# Patient Record
Sex: Male | Born: 1980 | Race: White | Hispanic: No | Marital: Single | Smoking: Former smoker
Health system: Southern US, Community
[De-identification: ages and names within clinical notes are randomized; demographics above are authoritative.]

## PROBLEM LIST (undated history)

## (undated) DIAGNOSIS — M436 Torticollis: Secondary | ICD-10-CM

---
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
## 2002-03-04 ENCOUNTER — Emergency Department (HOSPITAL_COMMUNITY): Admission: EM | Admit: 2002-03-04 | Discharge: 2002-03-04 | Payer: Self-pay | Admitting: Emergency Medicine

## 2002-03-13 ENCOUNTER — Emergency Department (HOSPITAL_COMMUNITY): Admission: EM | Admit: 2002-03-13 | Discharge: 2002-03-13 | Payer: Self-pay | Admitting: Emergency Medicine

## 2004-12-02 ENCOUNTER — Encounter: Admission: RE | Admit: 2004-12-02 | Discharge: 2004-12-02 | Payer: Self-pay | Admitting: Family Medicine

## 2004-12-02 ENCOUNTER — Ambulatory Visit: Payer: Self-pay | Admitting: Family Medicine

## 2004-12-04 ENCOUNTER — Ambulatory Visit: Payer: Self-pay | Admitting: Family Medicine

## 2005-05-25 ENCOUNTER — Ambulatory Visit: Payer: Self-pay | Admitting: Family Medicine

## 2008-03-10 ENCOUNTER — Emergency Department (HOSPITAL_COMMUNITY): Admission: EM | Admit: 2008-03-10 | Discharge: 2008-03-10 | Payer: Self-pay | Admitting: Family Medicine

## 2009-04-14 ENCOUNTER — Emergency Department (HOSPITAL_COMMUNITY): Admission: EM | Admit: 2009-04-14 | Discharge: 2009-04-14 | Payer: Self-pay | Admitting: Emergency Medicine

## 2009-07-16 ENCOUNTER — Emergency Department (HOSPITAL_COMMUNITY): Admission: EM | Admit: 2009-07-16 | Discharge: 2009-07-16 | Payer: Self-pay | Admitting: Family Medicine

## 2011-01-11 ENCOUNTER — Emergency Department (HOSPITAL_COMMUNITY)
Admission: EM | Admit: 2011-01-11 | Discharge: 2011-01-11 | Disposition: A | Discharge status: Home / Self Care | Payer: Self-pay | Attending: Emergency Medicine | Admitting: Emergency Medicine

## 2011-01-11 DIAGNOSIS — K089 Disorder of teeth and supporting structures, unspecified: Secondary | ICD-10-CM | POA: Insufficient documentation

## 2011-01-11 DIAGNOSIS — K029 Dental caries, unspecified: Secondary | ICD-10-CM | POA: Insufficient documentation

## 2011-01-11 DIAGNOSIS — K0381 Cracked tooth: Secondary | ICD-10-CM | POA: Insufficient documentation

## 2011-01-16 NOTE — Op Note (Signed)
[place]  Patient:    Luke Francis, Luke Francis Visit Number: [id number] MRN: [id number]          Service Type: DSU Location: [facility] Attending Physician:  Fernande Boyden Dictated by:   Gloris Manchester. Lazarus Salines, M.D. Proc. Date: 12/23/01 Admit Date:  12/23/2001                             Operative Report  PREOPERATIVE DIAGNOSIS:  Acute displaced nasal fracture.  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  OPERATION PERFORMED:  Attempted closed reduction of nasal fracture.  SURGEON:  Gloris Manchester. Lazarus Salines, M.D.  ANESTHESIA:  MAC.  ESTIMATED BLOOD LOSS:  None.  COMPLICATIONS:  None.  FINDINGS:  A prominently leftward deviated bony nasal dorsum essentially immobile to attempted closed reduction.  A corresponding narrowing of the right nasal chamber with some heavy right maxillary crest spurring from the septum.  DESCRIPTION OF PROCEDURE:  With the patient in a comfortable supine position, having received preoperative Afrin nasal spray for decongestion, intravenous sedation was administered.  At an appropriate level, the flat fracture elevator ____________ to the level of the medial canthus to avoid injury in the region of the cribriform plate.  It was placed under the nasal dorsum and with anterior and right lateral traction, attempting both nasal passages and attempted two or three separate times, I was unable to mobilize the nasal bones or the entire nasal dorsum.  There was no bleeding noted.  No external splint was felt to be required.  A small amount of old blood and saliva was suctioned free from the pharynx.  At this point the procedure was completed. The patient was returned to anesthesia, awakened and transferred to recovery in stable condition.  COMMENT:   An 31 year old white male 11 days status post a blunt trauma to the nose from a Lacrosse ball with reportedly new cosmetic and functional displacement was the indication for  todays procedure.  The finding of immobility suggests that it healed quickly or that this was in fact a premorbid injury deformity.  Anticipate a routine postoperative recovery with precautions today allowing anesthesia to be dissipated completely. Dictated by:   Gloris Manchester. Lazarus Salines, M.D. Attending Physician:  Fernande Boyden DD:  12/23/01 TD:  12/23/01 Job: [id number]

## 2012-07-05 ENCOUNTER — Encounter (HOSPITAL_COMMUNITY): Payer: Self-pay | Admitting: Physical Medicine and Rehabilitation

## 2012-07-05 ENCOUNTER — Emergency Department (HOSPITAL_COMMUNITY): Payer: Self-pay

## 2012-07-05 ENCOUNTER — Emergency Department (HOSPITAL_COMMUNITY)
Admission: EM | Admit: 2012-07-05 | Discharge: 2012-07-05 | Disposition: A | Discharge status: Home / Self Care | Payer: Self-pay | Attending: Emergency Medicine | Admitting: Emergency Medicine

## 2012-07-05 DIAGNOSIS — S62639A Displaced fracture of distal phalanx of unspecified finger, initial encounter for closed fracture: Secondary | ICD-10-CM | POA: Insufficient documentation

## 2012-07-05 DIAGNOSIS — Y929 Unspecified place or not applicable: Secondary | ICD-10-CM | POA: Insufficient documentation

## 2012-07-05 DIAGNOSIS — IMO0001 Reserved for inherently not codable concepts without codable children: Secondary | ICD-10-CM

## 2012-07-05 DIAGNOSIS — F17200 Nicotine dependence, unspecified, uncomplicated: Secondary | ICD-10-CM | POA: Insufficient documentation

## 2012-07-05 DIAGNOSIS — S62633A Displaced fracture of distal phalanx of left middle finger, initial encounter for closed fracture: Secondary | ICD-10-CM

## 2012-07-05 DIAGNOSIS — S61209A Unspecified open wound of unspecified finger without damage to nail, initial encounter: Secondary | ICD-10-CM | POA: Insufficient documentation

## 2012-07-05 DIAGNOSIS — S61218A Laceration without foreign body of other finger without damage to nail, initial encounter: Secondary | ICD-10-CM

## 2012-07-05 DIAGNOSIS — Y9389 Activity, other specified: Secondary | ICD-10-CM | POA: Insufficient documentation

## 2012-07-05 DIAGNOSIS — IMO0002 Reserved for concepts with insufficient information to code with codable children: Secondary | ICD-10-CM | POA: Insufficient documentation

## 2012-07-05 MED ORDER — OXYCODONE-ACETAMINOPHEN 5-325 MG PO TABS: 1.0000 | ORAL_TABLET | ORAL | Status: AC | PRN

## 2012-07-05 MED ORDER — LIDOCAINE HCL 2 % IJ SOLN
10.0000 mL | Freq: Once | INTRAMUSCULAR | Status: AC
  Administered 2012-07-05: 200 mg
  Filled 2012-07-05: qty 40

## 2012-07-05 MED ORDER — CEPHALEXIN 500 MG PO CAPS: 500.0000 mg | ORAL_CAPSULE | Freq: Four times a day (QID) | ORAL | Status: DC

## 2012-07-05 NOTE — ED Notes (Signed)
Patient has full ROM of left hand and all fingers of left hand.  All pulses present and within normal limits in bilateral upper extremities.

## 2012-07-05 NOTE — ED Provider Notes (Signed)
History  Scribed for No att. providers found, the patient was seen in room TR04C/TR04C. This chart was scribed by Candelaria Stagers. The patient's care started at 5:47 PM   CSN: [id number]  Arrival date & time 07/05/12  1449   None     Chief Complaint  Patient presents with  . Laceration     The history is provided by the patient. No language interpreter was used.   Luke Francis is a 31 y.o. male who presents to the Emergency Department complaining of a laceration to his left middle finger after a wooden desk landed on his finger while helping a friend move earlier today.  He describes the pain as a 5/10.  Pt reports he has had a tetanus shot in the last ten year.   No past medical history on file.  No past surgical history on file.  History reviewed. No pertinent family history.  History  Substance Use Topics  . Smoking status: Current Every Day Smoker    Types: Cigarettes  . Smokeless tobacco: Not on file  . Alcohol Use: Yes     Comment: social      Review of Systems  Skin: Positive for wound (laceration to the left middle finger).  All other systems reviewed and are negative.    Allergies  Review of patient's allergies indicates no known allergies.  Home Medications  No current outpatient prescriptions on file.  BP 141/87  Pulse 104  Temp 98 F (36.7 C) (Oral)  Resp 18  SpO2 99%  Physical Exam  Nursing note and vitals reviewed. Constitutional: He is oriented to person, place, and time. He appears well-developed and well-nourished. No distress.  HENT:  Head: Normocephalic and atraumatic.  Eyes: EOM are normal. Pupils are equal, round, and reactive to light.  Neck: Neck supple. No tracheal deviation present.  Cardiovascular: Normal rate.   Pulmonary/Chest: Effort normal. No respiratory distress.  Abdominal: Soft. He exhibits no distension.  Musculoskeletal: Normal range of motion. He exhibits no edema.       Ecchymosis and two puncture wounds with fat  extruding on the palm side of left middle finger. Subungual hematoma on the dorsum side.     Neurological: He is alert and oriented to person, place, and time. No sensory deficit.  Skin: Skin is warm and dry.  Psychiatric: He has a normal mood and affect. His behavior is normal.    ED Course  Procedures   DIAGNOSTIC STUDIES: Oxygen Saturation is 99% on room air, normal by my interpretation.    COORDINATION OF CARE:  2:59 Ordered: DG Finger Middle Left  5:51 PM Will stitch the wound and provide a finger splint.  Pt understands and agrees.   6:23 PMLACERATION REPAIR Performed by: Carleene Cooper III, MD Consent: Verbal consent obtained. Risks and benefits: risks, benefits and alternatives were discussed Patient identity confirmed: provided demographic data Time out performed prior to procedure Prepped and Draped in normal sterile fashion Wound explored Laceration Location: palmar surface of distal phalanx of left middle finger.  Laceration Length: 2  0.5 cm lacerations No Foreign Bodies seen or palpated Anesthesia: local infiltration Local anesthetic: lidocaine 2% without epinephrine Anesthetic total: 6 ml Irrigation method: syringe Amount of cleaning: standard Number of sutures or staples: 4 stitches with 5.0 prolene  Technique: simple suture  Patient tolerance: Patient tolerated the procedure well with no immediate complications.  6:54 PM cautery to burn a hole in nail to release subungual hematoma under the left middle fingernail  Labs Reviewed - No data to display Dg Finger Middle Left  07/05/2012  *RADIOLOGY REPORT*  Clinical Data: Pain post trauma  LEFT MIDDLE FINGER 2+V  Comparison: None.  Findings:  Frontal, oblique, lateral views were obtained.  There is a comminuted fracture of the distal aspect of the third distal phalanx.  Several fracture fragments are displaced.  There is also ungual disruption in this area.  No other fractures.  No dislocation.  There is distal  soft tissue injury.  IMPRESSION: Comminuted fracture distal aspect third distal phalanx with ungual disruption.  The fracture with the ungual disruption should be considered an open fracture.   Original Report Authenticated By: Bretta Bang, M.D.      IMP:  Lacerations of tip of left middle finger          Fracture of tuft of distal phalanx of left middle finger          Subungual hematoma of the left middle finger.   DISP:  Rest with left hand elevated.  Rx Keflex to protect from infection and Percocet q4h prn pain.  No work for one week.  Return for suture removal in 7 to 10 Days.   I personally performed the services described in this documentation, which was scribed in my presence. The recorded information has been reviewed and considered.  Osvaldo Human, MD     Carleene Cooper III, MD 07/05/12 1901

## 2012-07-05 NOTE — ED Notes (Signed)
Wound to left hand cleansed - dry dressing applied - pt tolerated procedure well

## 2012-07-05 NOTE — ED Notes (Signed)
Pt presents to department for evaluation of L middle finger injury. States he was lifting wooden desk when finger was smashed accidentally. 3 small lacerations noted to L middle finger. Bleeding controlled upon arrival. Pt states he is up to date on tetanus. 5/10 pain at the time. Able to wiggle digit. Capillary refill less than 2 seconds. Pt is alert and oriented x4.

## 2012-07-14 ENCOUNTER — Encounter (HOSPITAL_COMMUNITY): Payer: Self-pay | Admitting: Emergency Medicine

## 2012-07-14 ENCOUNTER — Emergency Department (INDEPENDENT_AMBULATORY_CARE_PROVIDER_SITE_OTHER)
Admission: EM | Admit: 2012-07-14 | Discharge: 2012-07-14 | Disposition: A | Discharge status: Home / Self Care | Payer: Self-pay | Source: Home / Self Care

## 2012-07-14 DIAGNOSIS — Z4802 Encounter for removal of sutures: Secondary | ICD-10-CM

## 2012-07-14 DIAGNOSIS — M79609 Pain in unspecified limb: Secondary | ICD-10-CM

## 2012-07-14 DIAGNOSIS — M79646 Pain in unspecified finger(s): Secondary | ICD-10-CM

## 2012-07-14 NOTE — ED Provider Notes (Signed)
Medical screening examination/treatment/procedure(s) were performed by non-physician practitioner and as supervising physician I was immediately available for consultation/collaboration.  Leslee Home, M.D.   Reuben Likes, MD 07/14/12 [phone]

## 2012-07-14 NOTE — ED Notes (Signed)
Pt in for suture removal from left index finger. Pt voices no concerns.

## 2012-07-14 NOTE — ED Provider Notes (Signed)
History     CSN: [id number]  Arrival date & time 07/14/12  1319   None     Chief Complaint  Patient presents with  . Suture / Staple Removal    suture removal. no complaints.    (Consider location/radiation/quality/duration/timing/severity/associated sxs/prior treatment) HPI Comments: 31 year old male who received a laceration to the distal aspect of his left middle finger 9 days ago. He was seen in the emergency department and the laceration was sutured. An x-ray was obtained and a tuft fracture was also seen. The patient was placed on empiric antibiotics as well as Percocet for pain medicine. He is in the urgent care to have the sutures removed.  The 4 sutures remain intact. The lacerations are healing well, edges have been well approximated, and there are no signs of infection. Some distal swelling remains but has improved over the initial swelling.Distal aspect remains tender    Patient is a 31 y.o. male presenting with suture removal.  Suture / Staple Removal     History reviewed. No pertinent past medical history.  History reviewed. No pertinent past surgical history.  History reviewed. No pertinent family history.  History  Substance Use Topics  . Smoking status: Current Every Day Smoker    Types: Cigarettes  . Smokeless tobacco: Not on file  . Alcohol Use: Yes     Comment: social      Review of Systems  All other systems reviewed and are negative.    Allergies  Review of patient's allergies indicates no known allergies.  Home Medications   Current Outpatient Rx  Name  Route  Sig  Dispense  Refill  . CEPHALEXIN 500 MG PO CAPS   Oral   Take 1 capsule (500 mg total) by mouth 4 (four) times daily.   20 capsule   0   . OXYCODONE-ACETAMINOPHEN 5-325 MG PO TABS   Oral   Take 1 tablet by mouth every 4 (four) hours as needed for pain.   20 tablet   0     BP 125/83  Pulse 74  Temp 98.3 F (36.8 C) (Oral)  Resp 18  SpO2 100%  Physical  Exam  ED Course  Procedures (including critical care time)  Labs Reviewed - No data to display No results found.   1. Visit for suture removal   2. Finger pain       MDM  All 4 sutures were removed. The sinus infection healing well there is still mild edema and moderate tenderness in the distal phalanx. Flexion and extension intact but somewhat limited due to the edema. And station intact. He went back to work a couple days ago but that lifting boxes and other object at [employer] hardware increases pain. I applied a distal finger protection splint so he may limit further injury to the finger. Turn for any signs of infection, increased swelling, redness, puffiness, drainage or other problems.        Hayden Rasmussen, NP 07/14/12 [phone]

## 2012-08-17 ENCOUNTER — Encounter (HOSPITAL_COMMUNITY): Payer: Self-pay | Admitting: Emergency Medicine

## 2012-08-17 ENCOUNTER — Emergency Department (HOSPITAL_COMMUNITY)
Admission: EM | Admit: 2012-08-17 | Discharge: 2012-08-17 | Disposition: A | Discharge status: Home / Self Care | Payer: Self-pay | Attending: Emergency Medicine | Admitting: Emergency Medicine

## 2012-08-17 ENCOUNTER — Emergency Department (HOSPITAL_COMMUNITY): Payer: Self-pay

## 2012-08-17 DIAGNOSIS — R05 Cough: Secondary | ICD-10-CM | POA: Insufficient documentation

## 2012-08-17 DIAGNOSIS — M2550 Pain in unspecified joint: Secondary | ICD-10-CM | POA: Insufficient documentation

## 2012-08-17 DIAGNOSIS — F17200 Nicotine dependence, unspecified, uncomplicated: Secondary | ICD-10-CM | POA: Insufficient documentation

## 2012-08-17 DIAGNOSIS — M25569 Pain in unspecified knee: Secondary | ICD-10-CM | POA: Insufficient documentation

## 2012-08-17 DIAGNOSIS — J029 Acute pharyngitis, unspecified: Secondary | ICD-10-CM | POA: Insufficient documentation

## 2012-08-17 DIAGNOSIS — B9789 Other viral agents as the cause of diseases classified elsewhere: Secondary | ICD-10-CM | POA: Insufficient documentation

## 2012-08-17 DIAGNOSIS — B349 Viral infection, unspecified: Secondary | ICD-10-CM

## 2012-08-17 DIAGNOSIS — R059 Cough, unspecified: Secondary | ICD-10-CM | POA: Insufficient documentation

## 2012-08-17 LAB — RAPID STREP SCREEN (MED CTR MEBANE ONLY): Streptococcus, Group A Screen (Direct): NEGATIVE

## 2012-08-17 MED ORDER — ACETAMINOPHEN 500 MG PO TABS: 1000.0000 mg | ORAL_TABLET | Freq: Once | ORAL | Status: DC

## 2012-08-17 MED ORDER — ACETAMINOPHEN 325 MG PO TABS
975.0000 mg | ORAL_TABLET | Freq: Once | ORAL | Status: AC
  Administered 2012-08-17: 975 mg via ORAL
  Filled 2012-08-17: qty 3

## 2012-08-17 NOTE — ED Provider Notes (Signed)
History    This chart was scribed for Geoffery Lyons, MD, MD by Smitty Pluck, ED Scribe. The patient was seen in room TR05C and the patient's care was started at 11:45AM.   CSN: [id number]  Arrival date & time 08/17/12  1103   None     Chief Complaint  Patient presents with  . Fever  . Knee Pain    (Consider location/radiation/quality/duration/timing/severity/associated sxs/prior treatment) The history is provided by the patient. No language interpreter was used.   Luke Francis is a 31 y.o. male who presents to the Emergency Department complaining of constant, moderate, sharp bilateral leg pain radiating from knee to ankles onset 1 day ago. Pt reports having fever this AM with temperature of 102.9. Pt's current temperature in ED is 100.8. Pt reports that he has cough with green sputum that started 3 days ago and mild sore throat. Pt denies sick contact, trauma to knees, fall, nausea, vomiting, diarrhea, taking medication PTA and any other pain. Pt denies receiving influenza vaccination this year.   History reviewed. No pertinent past medical history.  History reviewed. No pertinent past surgical history.  No family history on file.  History  Substance Use Topics  . Smoking status: Current Every Day Smoker -- 1.0 packs/day    Types: Cigarettes  . Smokeless tobacco: Not on file  . Alcohol Use: Yes     Comment: social      Review of Systems  Constitutional: Negative for fever and chills.  HENT: Positive for sore throat.   Respiratory: Positive for cough. Negative for shortness of breath.   Gastrointestinal: Negative for nausea, vomiting and diarrhea.  Musculoskeletal: Positive for arthralgias.  Neurological: Negative for weakness.  All other systems reviewed and are negative.    Allergies  Review of patient's allergies indicates no known allergies.  Home Medications   Current Outpatient Rx  Name  Route  Sig  Dispense  Refill  . CEPHALEXIN 500 MG PO CAPS   Oral  Take 1 capsule (500 mg total) by mouth 4 (four) times daily.   20 capsule   0     BP 118/71  Pulse 109  Temp 98.8 F (37.1 C) (Oral)  Resp 20  SpO2 99%  Physical Exam  Nursing note and vitals reviewed. Constitutional: He is oriented to person, place, and time. He appears well-developed and well-nourished. No distress.  HENT:  Head: Normocephalic and atraumatic.  Mouth/Throat: No oropharyngeal exudate.       Mildly erythematous   Eyes: EOM are normal.  Neck: Neck supple. No tracheal deviation present.  Cardiovascular: Normal rate, regular rhythm and intact distal pulses.   Pulmonary/Chest: Effort normal and breath sounds normal. No respiratory distress.  Musculoskeletal: Normal range of motion.       Knees appear grossly normal. There is no effusion or erythema or warmth.   Neurological: He is alert and oriented to person, place, and time.  Skin: Skin is warm and dry.  Psychiatric: He has a normal mood and affect. His behavior is normal.    ED Course  Procedures (including critical care time) DIAGNOSTIC STUDIES: Oxygen Saturation is 99% on room air, normal by my interpretation.    COORDINATION OF CARE: 11:49 AM Discussed ED treatment with pt         Labs Reviewed - No data to display Dg Chest 2 View  08/17/2012  *RADIOLOGY REPORT*  Clinical Data: Fever  CHEST - 2 VIEW  Comparison: None.  Findings: The lungs are clear without  focal consolidation, edema, effusion or pneumothorax.  Cardiopericardial silhouette is within normal limits for size.  Imaged bony structures of the thorax are intact.  IMPRESSION: Normal exam.   Original Report Authenticated By: Kennith Center, M.D.      No diagnosis found.    MDM  The chest xray and strep test are negative.  This is most likely a viral process.  Will recommend tylenol, motrin, fluids, and rest.      I personally performed the services described in this documentation, which was scribed in my presence. The recorded  information has been reviewed and is accurate.        Geoffery Lyons, MD 08/17/12 1310

## 2012-08-17 NOTE — ED Notes (Signed)
Pt ambulated to room with no issues. Pt c/o bilateral knee pain, denies injury.

## 2012-08-17 NOTE — ED Notes (Signed)
Pt c/o fever and got sharp pain this morning in bilateral knees. Pt denies taking any medications for fever. Pt denies hx of knee pain/sx.

## 2013-02-28 ENCOUNTER — Encounter (HOSPITAL_COMMUNITY): Payer: Self-pay | Admitting: Emergency Medicine

## 2013-02-28 ENCOUNTER — Emergency Department (HOSPITAL_COMMUNITY)
Admission: EM | Admit: 2013-02-28 | Discharge: 2013-02-28 | Disposition: A | Discharge status: Home / Self Care | Payer: BC Managed Care – PPO | Attending: Emergency Medicine | Admitting: Emergency Medicine

## 2013-02-28 ENCOUNTER — Emergency Department (HOSPITAL_COMMUNITY)
Admission: EM | Admit: 2013-02-28 | Discharge: 2013-02-28 | Disposition: A | Discharge status: Home / Self Care | Payer: BC Managed Care – PPO | Source: Home / Self Care

## 2013-02-28 DIAGNOSIS — K469 Unspecified abdominal hernia without obstruction or gangrene: Secondary | ICD-10-CM

## 2013-02-28 DIAGNOSIS — K4091 Unilateral inguinal hernia, without obstruction or gangrene, recurrent: Secondary | ICD-10-CM

## 2013-02-28 DIAGNOSIS — F17200 Nicotine dependence, unspecified, uncomplicated: Secondary | ICD-10-CM | POA: Insufficient documentation

## 2013-02-28 NOTE — ED Provider Notes (Signed)
See previous note from urgent care for H&P.   Patient sent from urgent care Center for evaluation of hernia.  Hernia is currently reducing patient's without complaints.  He released himself last night.  Plan at this time is to consult surgery follow up as outpatient and give limited duty.  Consulted with general surgery.  Demographic information given.  Will place on light duty until sees them.  Given the caution to return to emergency department should he develop swelling or new symptoms.  Nelia Shi, MD 02/28/13 [phone]

## 2013-02-28 NOTE — ED Notes (Signed)
Complains about groin/pelvic pain and he thinks its a hernia.

## 2013-02-28 NOTE — ED Notes (Signed)
Pt c/o left sided groin pain worse since last night; pt sts thinks could be hernia and sent from [facility] for further eval

## 2013-02-28 NOTE — Progress Notes (Addendum)
Called from ED regarding this patient.  Pt's hernia is now reduced, pain is improved.  This is not incarcerated if it is reducible.  Will have the patient follow up in the office for a new patient evaluation.  Agree with lifting no more than 15-20lbs until follow up.

## 2013-02-28 NOTE — ED Provider Notes (Signed)
   History    CSN: [id number] Arrival date & time 02/28/13  1157  First MD Initiated Contact with Patient 02/28/13 1338     Chief Complaint  Patient presents with  . Hernia    pt thinks he has hernia   (Consider location/radiation/quality/duration/timing/severity/associated sxs/prior Treatment) HPI  32 yo wm presents with the above complaint.  Thinks that he has had a left sided hernia for at least one year.  Has never seen anyone for this previously.  Says that he could reduce this without issues and that's why he has never had evaluation.  He has been having increased left side abd pain that extends to his groin over the last 3 weeks.  States that he is having trouble reducing this now.  Pain with ambulation.  Denies nausa, vomiting, fever, chills, dysuria, hematuria, changes in bowel habits.   No past medical history on file. No past surgical history on file. No family history on file. History  Substance Use Topics  . Smoking status: Current Every Day Smoker -- 1.00 packs/day    Types: Cigarettes  . Smokeless tobacco: Not on file  . Alcohol Use: Yes     Comment: social    Review of Systems  Constitutional: Negative.   HENT: Negative.   Eyes: Negative.   Respiratory: Negative.   Gastrointestinal: Negative.   Endocrine: Negative.   Genitourinary: Negative for dysuria, frequency, hematuria, flank pain, discharge, scrotal swelling, difficulty urinating and testicular pain.  Neurological: Negative.   Psychiatric/Behavioral: Negative.     Allergies  Review of patient's allergies indicates no known allergies.  Home Medications  No current outpatient prescriptions on file. BP 123/82  Pulse 83  Temp(Src) 98 F (36.7 C) (Oral)  SpO2 100% Physical Exam  Constitutional: He is oriented to person, place, and time. He appears well-developed and well-nourished.  HENT:  Head: Normocephalic and atraumatic.  Eyes: EOM are normal. Pupils are equal, round, and reactive to light.   Neck: Normal range of motion.  Cardiovascular: Normal rate and regular rhythm.   Pulmonary/Chest: Effort normal and breath sounds normal.  Abdominal: Soft. He exhibits no distension and no mass. There is tenderness (periumbilical and left lower with deep palpation. ). There is no rebound and no guarding.  Musculoskeletal: Normal range of motion.  Neurological: He is alert and oriented to person, place, and time.  Skin: Skin is warm and dry.  Psychiatric: He has a normal mood and affect.    ED Course  Procedures (including critical care time) Labs Reviewed - No data to display No results found. 1. Hernia     MDM  Will send down to ED for ultrasound.  Advised physician will decide if general surgery consult is needed.   All questions answered.    Zonia Kief, PA-C 02/28/13 1433

## 2013-02-28 NOTE — ED Provider Notes (Signed)
Medical screening examination/treatment/procedure(s) were performed by non-physician practitioner and as supervising physician I was immediately available for consultation/collaboration.  Leslee Home, M.D.  Reuben Likes, MD 02/28/13 [phone]

## 2013-03-14 ENCOUNTER — Encounter (INDEPENDENT_AMBULATORY_CARE_PROVIDER_SITE_OTHER): Payer: Self-pay | Admitting: General Surgery

## 2013-03-14 ENCOUNTER — Ambulatory Visit (INDEPENDENT_AMBULATORY_CARE_PROVIDER_SITE_OTHER): Payer: BC Managed Care – PPO | Admitting: General Surgery

## 2013-03-14 VITALS — BP 124/84 | HR 83 | Resp 16 | Ht 74.0 in | Wt 193.2 lb

## 2013-03-14 DIAGNOSIS — K4090 Unilateral inguinal hernia, without obstruction or gangrene, not specified as recurrent: Secondary | ICD-10-CM

## 2013-03-14 NOTE — Progress Notes (Signed)
Patient ID: Luke Francis, male   DOB: May 09, 1981, 32 y.o.   MRN: [id number]  Chief Complaint  Patient presents with  . New Evaluation    eval [facility]    HPI Luke Francis is a 32 y.o. male.  The patient is a 32 year old male who is referred by Dr. Manus Gunning for evaluation of a left inguinal hernia. The patient states that approximately 2-3 weeks 0 he had a incarcerated left inguinal hernia that was reduced. Since the ER for evaluation. The patient does work as a Nurse, adult.  The patient has minimal amount of discomfort in the area however no overt pain.   HPI  History reviewed. No pertinent past medical history.  History reviewed. No pertinent past surgical history.  Family History  Problem Relation Age of Onset  . Hypertension Mother   . Cancer Maternal Grandmother     Breast/ Colon    Social History History  Substance Use Topics  . Smoking status: Current Every Day Smoker -- 1.00 packs/day    Types: Cigarettes  . Smokeless tobacco: Not on file  . Alcohol Use: Yes     Comment: social    No Known Allergies  No current outpatient prescriptions on file.   No current facility-administered medications for this visit.    Review of Systems Review of Systems  Constitutional: Negative.   HENT: Negative.   Respiratory: Negative.   Cardiovascular: Negative.   Gastrointestinal: Negative.   Neurological: Negative.   All other systems reviewed and are negative.    Blood pressure 124/84, pulse 83, resp. rate 16, height 6\' 2"  (1.88 m), weight 193 lb 3.2 oz (87.635 kg), SpO2 97.00%.  Physical Exam Physical Exam  Constitutional: He is oriented to person, place, and time. He appears well-developed and well-nourished.  HENT:  Head: Normocephalic and atraumatic.  Eyes: Conjunctivae and EOM are normal. Pupils are equal, round, and reactive to light.  Neck: Normal range of motion. Neck supple.  Cardiovascular: Normal rate, regular rhythm and normal heart sounds.    Pulmonary/Chest: Effort normal and breath sounds normal.  Abdominal: Soft. Bowel sounds are normal. A hernia is present. Hernia confirmed positive in the left inguinal area.  Reducible left inguinal hernia, likely direct  Musculoskeletal: Normal range of motion.  Neurological: He is alert and oriented to person, place, and time.    Data Reviewed None   Assessment    32 year old male with a reducible left, likely direct inguinal hernia.     Plan    1 we'll proceed to the operating room for a laparoscopic left inguinal hernia repair with mesh. 2. All risks and benefits were discussed with the patient, to generally include infection, bleeding, damage to surrounding structures, acute and chronic nerve pain, and recurrence. Alternatives were offered and described.  All questions were answered and the patient voiced understanding of the procedure and wishes to proceed at this point.         Marigene Ehlers., General Wearing 03/14/2013, 1:41 PM

## 2014-06-19 ENCOUNTER — Other Ambulatory Visit (HOSPITAL_COMMUNITY): Payer: Self-pay | Admitting: Physical Medicine and Rehabilitation

## 2014-06-19 DIAGNOSIS — S161XXA Strain of muscle, fascia and tendon at neck level, initial encounter: Secondary | ICD-10-CM

## 2014-06-26 DIAGNOSIS — Z0271 Encounter for disability determination: Secondary | ICD-10-CM

## 2014-07-05 ENCOUNTER — Ambulatory Visit (HOSPITAL_COMMUNITY)
Admission: RE | Admit: 2014-07-05 | Discharge: 2014-07-05 | Disposition: A | Discharge status: Home / Self Care | Payer: Worker's Compensation | Source: Ambulatory Visit | Attending: Diagnostic Radiology | Admitting: Diagnostic Radiology

## 2014-07-05 DIAGNOSIS — M436 Torticollis: Secondary | ICD-10-CM | POA: Insufficient documentation

## 2014-07-05 DIAGNOSIS — M4802 Spinal stenosis, cervical region: Secondary | ICD-10-CM | POA: Insufficient documentation

## 2014-07-05 DIAGNOSIS — S161XXA Strain of muscle, fascia and tendon at neck level, initial encounter: Secondary | ICD-10-CM

## 2014-12-10 DIAGNOSIS — S0191XA Laceration without foreign body of unspecified part of head, initial encounter: Secondary | ICD-10-CM | POA: Insufficient documentation

## 2014-12-10 DIAGNOSIS — Z8739 Personal history of other diseases of the musculoskeletal system and connective tissue: Secondary | ICD-10-CM | POA: Insufficient documentation

## 2014-12-10 DIAGNOSIS — Y929 Unspecified place or not applicable: Secondary | ICD-10-CM | POA: Insufficient documentation

## 2014-12-10 DIAGNOSIS — Z720 Tobacco use: Secondary | ICD-10-CM | POA: Insufficient documentation

## 2014-12-10 DIAGNOSIS — S0121XA Laceration without foreign body of nose, initial encounter: Secondary | ICD-10-CM | POA: Insufficient documentation

## 2014-12-10 DIAGNOSIS — Y999 Unspecified external cause status: Secondary | ICD-10-CM | POA: Insufficient documentation

## 2014-12-10 DIAGNOSIS — Y939 Activity, unspecified: Secondary | ICD-10-CM | POA: Insufficient documentation

## 2014-12-10 DIAGNOSIS — W01198A Fall on same level from slipping, tripping and stumbling with subsequent striking against other object, initial encounter: Secondary | ICD-10-CM | POA: Insufficient documentation

## 2014-12-10 NOTE — ED Notes (Signed)
The patient said he tripped, fell and hit a wooden bar.  He said his nose bled for about three hours but it is controlled now.  He rates his pain 1/10.  He denies LOC, nausea or vomiting.

## 2014-12-11 ENCOUNTER — Encounter (HOSPITAL_COMMUNITY): Payer: Self-pay | Admitting: Emergency Medicine

## 2014-12-11 ENCOUNTER — Emergency Department (HOSPITAL_COMMUNITY)
Admission: EM | Admit: 2014-12-11 | Discharge: 2014-12-11 | Disposition: A | Discharge status: Home / Self Care | Payer: Self-pay | Attending: Emergency Medicine | Admitting: Emergency Medicine

## 2014-12-11 DIAGNOSIS — S0081XA Abrasion of other part of head, initial encounter: Secondary | ICD-10-CM

## 2014-12-11 HISTORY — DX: Torticollis: M43.6

## 2014-12-11 MED ORDER — LIDOCAINE-EPINEPHRINE (PF) 2 %-1:200000 IJ SOLN
10.0000 mL | Freq: Once | INTRAMUSCULAR | Status: DC
  Filled 2014-12-11: qty 20

## 2014-12-11 NOTE — Discharge Instructions (Signed)
Nasal Fracture A nasal fracture is a break or crack in the bones of the nose. A minor break usually heals in a month. You often will receive black eyes from a nasal fracture. This is not a cause for concern. The black eyes will go away over 1 to 2 weeks.  DIAGNOSIS  Your caregiver may want to examine you if you are concerned about a fracture of the nose. X-rays of the nose may not show a nasal fracture even when one is present. Sometimes your caregiver must wait 1 to 5 days after the injury to re-check the nose for alignment and to take additional X-rays. Sometimes the caregiver must wait until the swelling has gone down. TREATMENT Minor fractures that have caused no deformity often do not require treatment. More serious fractures where bones are displaced may require surgery. This will take place after the swelling is gone. Surgery will stabilize and align the fracture. HOME CARE INSTRUCTIONS   Put ice on the injured area.  Put ice in a plastic bag.  Place a towel between your skin and the bag.  Leave the ice on for 15-20 minutes, 03-04 times a day.  Take medications as directed by your caregiver.  Only take over-the-counter or prescription medicines for pain, discomfort, or fever as directed by your caregiver.  If your nose starts bleeding, squeeze the soft parts of the nose against the center wall while you are sitting in an upright position for 10 minutes.  Contact sports should be avoided for at least 3 to 4 weeks or as directed by your caregiver. SEEK MEDICAL CARE IF:  Your pain increases or becomes severe.  You continue to have nosebleeds.  The shape of your nose does not return to normal within 5 days.  You have pus draining from the nose. SEEK IMMEDIATE MEDICAL CARE IF:   You have bleeding from your nose that does not stop after 20 minutes of pinching the nostrils closed and keeping ice on the nose.  You have clear fluid draining from your nose.  You notice a grape-like  swelling on the dividing wall between the nostrils (septum). This is a collection of blood (hematoma) that must be drained to help prevent infection.  You have difficulty moving your eyes.  You have recurrent vomiting. Document Released: 08/14/2000 Document Revised: 11/09/2011 Document Reviewed: 12/01/2010 [facility] Patient Information 2015 [place], [place]. This information is not intended to replace advice given to you by your health care provider. Make sure you discuss any questions you have with your health care provider. Facial or Scalp Contusion A facial or scalp contusion is a deep bruise on the face or head. Injuries to the face and head generally cause a lot of swelling, especially around the eyes. Contusions are the result of an injury that caused bleeding under the skin. The contusion may turn blue, purple, or yellow. Minor injuries will give you a painless contusion, but more severe contusions may stay painful and swollen for a few weeks.  CAUSES  A facial or scalp contusion is caused by a blunt injury or trauma to the face or head area.  SIGNS AND SYMPTOMS   Swelling of the injured area.   Discoloration of the injured area.   Tenderness, soreness, or pain in the injured area.  DIAGNOSIS  The diagnosis can be made by taking a medical history and doing a physical exam. An X-ray exam, CT scan, or MRI may be needed to determine if there are any associated injuries, such  as broken bones (fractures). TREATMENT  Often, the best treatment for a facial or scalp contusion is applying cold compresses to the injured area. Over-the-counter medicines may also be recommended for pain control.  HOME CARE INSTRUCTIONS   Only take over-the-counter or prescription medicines as directed by your health care provider.   Apply ice to the injured area.   Put ice in a plastic bag.   Place a towel between your skin and the bag.   Leave the ice on for 20 minutes, 2-3 times a day.  SEEK  MEDICAL CARE IF:  You have bite problems.   You have pain with chewing.   You are concerned about facial defects. SEEK IMMEDIATE MEDICAL CARE IF:  You have severe pain or a headache that is not relieved by medicine.   You have unusual sleepiness, confusion, or personality changes.   You throw up (vomit).   You have a persistent nosebleed.   You have double vision or blurred vision.   You have fluid drainage from your nose or ear.   You have difficulty walking or using your arms or legs.  MAKE SURE YOU:   Understand these instructions.  Will watch your condition.  Will get help right away if you are not doing well or get worse. Document Released: 09/24/2004 Document Revised: 06/07/2013 Document Reviewed: 03/30/2013 [facility] Patient Information 2015 [place]. This information is not intended to replace advice given to you by your health care provider. Make sure you discuss any questions you have with your health care provider.

## 2014-12-11 NOTE — ED Notes (Signed)
Pt c/o laceration to nose. Pt tripped and fell face first into a wooden bar. Reports bleeding from both nostrils at the time of the fall. Denies LOC. No bleeding at this time.

## 2014-12-11 NOTE — ED Notes (Signed)
Pt requesting discharge; refuses sutures at this time. PA made aware; dermabond ordered, pt refused dermabond.

## 2014-12-11 NOTE — ED Provider Notes (Signed)
CSN: [id number]     Arrival date & time 12/10/14  2336 History   First MD Initiated Contact with Patient 12/11/14 0026     Chief Complaint  Patient presents with  . Facial Laceration    The patient said he tripped, fell and hit a wooden bar.  He said his nose bled for about three hours but it is controlled now.     (Consider location/radiation/quality/duration/timing/severity/associated sxs/prior Treatment) HPI    PCP: Luke Lance, MD Blood pressure 157/91, pulse 112, temperature 98.1 F (36.7 C), resp. rate 20, weight 248 lb 3 oz (112.577 kg), SpO2 99 %.  Luke Francis is a 34 y.o.male with a significant PMH of torticollis presents to the ER with complaints of facial injury this evening and brought in by his mother. He reports looking at his phone and accidentally walked into a wooden bar. He had epistaxis that lasted for a few hours. He has sustained a small cut to his nasal bridge. He denies loc or neck injury. His mom reports bringing him in to see whether he needs to have his nose set. He is having a pain of 1/10.   Negative Review of Symptoms: loc, neck pain, chest pain, back pain, eye pain, ear pain, mouth pain, fever.   Past Medical History  Diagnosis Date  . Torticollis    History reviewed. No pertinent past surgical history. Family History  Problem Relation Age of Onset  . Hypertension Mother   . Cancer Maternal Grandmother     Breast/ Luke   History  Substance Use Topics  . Smoking status: Current Every Day Smoker -- 1.00 packs/day    Types: Cigarettes  . Smokeless tobacco: Not on file  . Alcohol Use: Yes     Comment: social    Review of Systems  10 Systems reviewed and are negative for acute change except as noted in the HPI.    Allergies  Review of patient's allergies indicates no known allergies.  Home Medications   Prior to Admission medications   Not on File   BP 157/91 mmHg  Pulse 112  Temp(Src) 98.1 F (36.7 C)  Resp 20  Wt 248 lb 3  oz (112.577 kg)  SpO2 99% Physical Exam  Constitutional: He appears well-developed and well-nourished. No distress.  HENT:  Head: Normocephalic. Head is with abrasion, with contusion and with laceration. Head is without raccoon's eyes, without Battle's sign, without right periorbital erythema and without left periorbital erythema.  Right Ear: Tympanic membrane and ear canal normal.  Left Ear: Tympanic membrane and ear canal normal.  Nose: Nose lacerations present. No sinus tenderness, nasal deformity or septal deviation. No epistaxis (no active epistaxis).    Mouth/Throat: Uvula is midline, oropharynx is clear and moist and mucous membranes are normal.  Eyes: Conjunctivae, EOM and lids are normal. Pupils are equal, round, and reactive to light.  Neck: Normal range of motion. Neck supple. No spinous process tenderness and no muscular tenderness present.  Cardiovascular: Normal rate and regular rhythm.   Pulmonary/Chest: Effort normal and breath sounds normal. No accessory muscle usage. No respiratory distress.  Abdominal: Soft.  Neurological: He is alert.  Skin: Skin is warm and dry.  Nursing note and vitals reviewed.   ED Course  Procedures (including critical care time) Labs Review Labs Reviewed - No data to display  Imaging Review No results found.   EKG Interpretation None      MDM   Final diagnoses:  Facial abrasion, initial encounter  Pt reported being UTD on tetanus shot. He declined imaging CT or xray reports "only being here to see if his nose needs to be set". The nurse also witnessed he declined sutures, which I recommended. I offered the patient Dermabond but they declined this as well. I discussed with the patient that fracture cannot be excluded without imaging but that a small fx would not alter treatment plan. EOMIs intact, no septal hematoma or deviation. NO racoon eye or battle sign. Patient requesting to leave without any further evaluation, deny  that they are unsatisfied with there visit but report being tired and wanting to leave.  33 y.o.Luke Flatteryaul D Francis's evaluation in the Emergency Department is complete. It has been determined that no acute conditions requiring further emergency intervention are present at this time. The patient/guardian have been advised of the diagnosis and plan. We have discussed signs and symptoms that warrant return to the ED, such as changes or worsening in symptoms.  Vital signs are stable at discharge. Filed Vitals:   12/10/14 2347  BP: 157/91  Pulse: 112  Temp: 98.1 F (36.7 C)  Resp: 20    Patient/guardian has voiced understanding and agreed to follow-up with the PCP or specialist.     Luke Peliffany Keygan Dumond, PA-C 12/11/14 [id number]  Eber HongBrian Miller, MD 12/11/14 [phone]

## 2015-01-01 ENCOUNTER — Emergency Department (HOSPITAL_COMMUNITY): Payer: BLUE CROSS/BLUE SHIELD | Admitting: Certified Registered Nurse Anesthetist

## 2015-01-01 ENCOUNTER — Encounter (HOSPITAL_COMMUNITY)
Admission: EM | Disposition: A | Discharge status: Home / Self Care | Payer: Self-pay | Source: Home / Self Care | Attending: Emergency Medicine

## 2015-01-01 ENCOUNTER — Ambulatory Visit (HOSPITAL_COMMUNITY)
Admission: EM | Admit: 2015-01-01 | Discharge: 2015-01-01 | Disposition: A | Discharge status: Home / Self Care | Payer: Self-pay | Attending: Emergency Medicine | Admitting: Emergency Medicine

## 2015-01-01 ENCOUNTER — Encounter (HOSPITAL_COMMUNITY): Payer: Self-pay | Admitting: Emergency Medicine

## 2015-01-01 ENCOUNTER — Emergency Department (HOSPITAL_COMMUNITY): Payer: Self-pay

## 2015-01-01 ENCOUNTER — Emergency Department (HOSPITAL_COMMUNITY): Payer: Self-pay | Admitting: Certified Registered Nurse Anesthetist

## 2015-01-01 DIAGNOSIS — S61412A Laceration without foreign body of left hand, initial encounter: Secondary | ICD-10-CM

## 2015-01-01 DIAGNOSIS — W260XXA Contact with knife, initial encounter: Secondary | ICD-10-CM | POA: Insufficient documentation

## 2015-01-01 DIAGNOSIS — S65112A Laceration of radial artery at wrist and hand level of left arm, initial encounter: Secondary | ICD-10-CM | POA: Insufficient documentation

## 2015-01-01 DIAGNOSIS — M436 Torticollis: Secondary | ICD-10-CM | POA: Insufficient documentation

## 2015-01-01 DIAGNOSIS — Z87891 Personal history of nicotine dependence: Secondary | ICD-10-CM | POA: Insufficient documentation

## 2015-01-01 DIAGNOSIS — S66822A Laceration of other specified muscles, fascia and tendons at wrist and hand level, left hand, initial encounter: Secondary | ICD-10-CM | POA: Insufficient documentation

## 2015-01-01 HISTORY — PX: ARTERY AND TENDON REPAIR: SHX5696

## 2015-01-01 HISTORY — PX: I&D EXTREMITY: SHX5045

## 2015-01-01 SURGERY — IRRIGATION AND DEBRIDEMENT EXTREMITY
Anesthesia: General | Site: Hand | Laterality: Left

## 2015-01-01 MED ORDER — HYDROCODONE-ACETAMINOPHEN 5-325 MG PO TABS
1.0000 | ORAL_TABLET | Freq: Once | ORAL | Status: AC
  Administered 2015-01-01: 1 via ORAL
  Filled 2015-01-01: qty 1

## 2015-01-01 MED ORDER — SODIUM CHLORIDE 0.9 % IR SOLN
Status: DC | PRN
  Administered 2015-01-01: 3000 mL

## 2015-01-01 MED ORDER — MORPHINE SULFATE 4 MG/ML IJ SOLN
4.0000 mg | Freq: Once | INTRAMUSCULAR | Status: AC
  Administered 2015-01-01: 4 mg via INTRAVENOUS
  Filled 2015-01-01: qty 1

## 2015-01-01 MED ORDER — OXYCODONE HCL 5 MG PO TABS
ORAL_TABLET | ORAL | Status: AC
  Filled 2015-01-01: qty 1

## 2015-01-01 MED ORDER — CEPHALEXIN 500 MG PO CAPS: 500.0000 mg | ORAL_CAPSULE | Freq: Four times a day (QID) | ORAL | Status: AC

## 2015-01-01 MED ORDER — MIDAZOLAM HCL 5 MG/5ML IJ SOLN
INTRAMUSCULAR | Status: DC | PRN
  Administered 2015-01-01: 2 mg via INTRAVENOUS

## 2015-01-01 MED ORDER — SODIUM CHLORIDE 0.9 % IV SOLN
Freq: Once | INTRAVENOUS | Status: AC
  Administered 2015-01-01: 17:00:00 via INTRAVENOUS

## 2015-01-01 MED ORDER — LACTATED RINGERS IV SOLN
INTRAVENOUS | Status: DC | PRN
  Administered 2015-01-01: 19:00:00 via INTRAVENOUS

## 2015-01-01 MED ORDER — BUPIVACAINE HCL (PF) 0.25 % IJ SOLN
INTRAMUSCULAR | Status: AC
  Filled 2015-01-01: qty 30

## 2015-01-01 MED ORDER — 0.9 % SODIUM CHLORIDE (POUR BTL) OPTIME
TOPICAL | Status: DC | PRN
  Administered 2015-01-01: 1000 mL

## 2015-01-01 MED ORDER — MIDAZOLAM HCL 2 MG/2ML IJ SOLN
INTRAMUSCULAR | Status: AC
  Filled 2015-01-01: qty 2

## 2015-01-01 MED ORDER — SODIUM CHLORIDE 0.9 % IV SOLN
INTRAVENOUS | Status: DC | PRN
  Administered 2015-01-01: 17:00:00 via INTRAVENOUS

## 2015-01-01 MED ORDER — ONDANSETRON HCL 4 MG/2ML IJ SOLN
INTRAMUSCULAR | Status: DC | PRN
  Administered 2015-01-01: 4 mg via INTRAVENOUS

## 2015-01-01 MED ORDER — FENTANYL CITRATE (PF) 100 MCG/2ML IJ SOLN
100.0000 ug | Freq: Once | INTRAMUSCULAR | Status: AC
  Administered 2015-01-01: 100 ug via NASAL
  Filled 2015-01-01: qty 2

## 2015-01-01 MED ORDER — LIDOCAINE HCL (PF) 1 % IJ SOLN
5.0000 mL | Freq: Once | INTRAMUSCULAR | Status: AC
  Administered 2015-01-01: 5 mL via INTRADERMAL
  Filled 2015-01-01: qty 5

## 2015-01-01 MED ORDER — DEXAMETHASONE SODIUM PHOSPHATE 4 MG/ML IJ SOLN
INTRAMUSCULAR | Status: AC
  Filled 2015-01-01: qty 1

## 2015-01-01 MED ORDER — TETANUS-DIPHTH-ACELL PERTUSSIS 5-2.5-18.5 LF-MCG/0.5 IM SUSP
0.5000 mL | Freq: Once | INTRAMUSCULAR | Status: AC
  Administered 2015-01-01: 0.5 mL via INTRAMUSCULAR
  Filled 2015-01-01: qty 0.5

## 2015-01-01 MED ORDER — HYDROMORPHONE HCL 1 MG/ML IJ SOLN
INTRAMUSCULAR | Status: AC
  Filled 2015-01-01: qty 2

## 2015-01-01 MED ORDER — FENTANYL CITRATE (PF) 100 MCG/2ML IJ SOLN
INTRAMUSCULAR | Status: DC | PRN
  Administered 2015-01-01 (×2): 25 ug via INTRAVENOUS
  Administered 2015-01-01: 150 ug via INTRAVENOUS

## 2015-01-01 MED ORDER — PROMETHAZINE HCL 25 MG/ML IJ SOLN: 6.2500 mg | INTRAMUSCULAR | Status: DC | PRN

## 2015-01-01 MED ORDER — ONDANSETRON HCL 4 MG/2ML IJ SOLN
4.0000 mg | Freq: Once | INTRAMUSCULAR | Status: AC
  Administered 2015-01-01: 4 mg via INTRAVENOUS
  Filled 2015-01-01: qty 2

## 2015-01-01 MED ORDER — NEOSTIGMINE METHYLSULFATE 10 MG/10ML IV SOLN
INTRAVENOUS | Status: AC
  Filled 2015-01-01: qty 1

## 2015-01-01 MED ORDER — SUCCINYLCHOLINE CHLORIDE 20 MG/ML IJ SOLN
INTRAMUSCULAR | Status: AC
  Filled 2015-01-01: qty 1

## 2015-01-01 MED ORDER — DEXAMETHASONE SODIUM PHOSPHATE 4 MG/ML IJ SOLN
INTRAMUSCULAR | Status: DC | PRN
  Administered 2015-01-01: 4 mg via INTRAVENOUS

## 2015-01-01 MED ORDER — BUPIVACAINE HCL (PF) 0.25 % IJ SOLN
INTRAMUSCULAR | Status: DC | PRN
  Administered 2015-01-01: 10 mL

## 2015-01-01 MED ORDER — CEPHALEXIN 500 MG PO CAPS
500.0000 mg | ORAL_CAPSULE | Freq: Four times a day (QID) | ORAL | Status: AC
Start: 2015-01-01 — End: ?

## 2015-01-01 MED ORDER — LIDOCAINE HCL (CARDIAC) 20 MG/ML IV SOLN
INTRAVENOUS | Status: DC | PRN
  Administered 2015-01-01: 20 mg via INTRAVENOUS

## 2015-01-01 MED ORDER — OXYCODONE HCL 5 MG PO TABS
5.0000 mg | ORAL_TABLET | Freq: Once | ORAL | Status: AC
  Administered 2015-01-01: 5 mg via ORAL

## 2015-01-01 MED ORDER — PROPOFOL 10 MG/ML IV BOLUS
INTRAVENOUS | Status: AC
  Filled 2015-01-01: qty 20

## 2015-01-01 MED ORDER — HYDROMORPHONE HCL 1 MG/ML IJ SOLN
0.2500 mg | INTRAMUSCULAR | Status: DC | PRN
  Administered 2015-01-01 (×2): 0.5 mg via INTRAVENOUS

## 2015-01-01 MED ORDER — OXYCODONE HCL 5 MG PO TABS: 10.0000 mg | ORAL_TABLET | ORAL | Status: AC | PRN

## 2015-01-01 MED ORDER — FENTANYL CITRATE (PF) 250 MCG/5ML IJ SOLN
INTRAMUSCULAR | Status: AC
  Filled 2015-01-01: qty 5

## 2015-01-01 MED ORDER — PROPOFOL 10 MG/ML IV BOLUS
INTRAVENOUS | Status: DC | PRN
  Administered 2015-01-01: 200 mg via INTRAVENOUS

## 2015-01-01 MED ORDER — CEFAZOLIN SODIUM-DEXTROSE 2-3 GM-% IV SOLR
2.0000 g | Freq: Once | INTRAVENOUS | Status: AC
  Administered 2015-01-01: 2 g via INTRAVENOUS
  Filled 2015-01-01: qty 50

## 2015-01-01 SURGICAL SUPPLY — 51 items
BANDAGE ELASTIC 3 VELCRO ST LF (GAUZE/BANDAGES/DRESSINGS) ×3 IMPLANT
BANDAGE ELASTIC 4 VELCRO ST LF (GAUZE/BANDAGES/DRESSINGS) ×3 IMPLANT
BNDG CONFORM 2 STRL LF (GAUZE/BANDAGES/DRESSINGS) ×3 IMPLANT
BNDG GAUZE ELAST 4 BULKY (GAUZE/BANDAGES/DRESSINGS) ×9 IMPLANT
CORDS BIPOLAR (ELECTRODE) ×3 IMPLANT
CUFF TOURNIQUET SINGLE 18IN (TOURNIQUET CUFF) ×3 IMPLANT
CUFF TOURNIQUET SINGLE 24IN (TOURNIQUET CUFF) IMPLANT
DRSG ADAPTIC 3X8 NADH LF (GAUZE/BANDAGES/DRESSINGS) ×3 IMPLANT
ELECT REM PT RETURN 9FT ADLT (ELECTROSURGICAL) ×3
ELECTRODE REM PT RTRN 9FT ADLT (ELECTROSURGICAL) ×1 IMPLANT
GAUZE SPONGE 4X4 12PLY STRL (GAUZE/BANDAGES/DRESSINGS) ×3 IMPLANT
GAUZE SPONGE 4X4 16PLY XRAY LF (GAUZE/BANDAGES/DRESSINGS) ×3 IMPLANT
GAUZE XEROFORM 1X8 LF (GAUZE/BANDAGES/DRESSINGS) ×3 IMPLANT
GLOVE BIOGEL M STRL SZ7.5 (GLOVE) ×3 IMPLANT
GLOVE BIOGEL PI ORTHO PRO SZ7 (GLOVE) ×4
GLOVE PI ORTHO PRO STRL SZ7 (GLOVE) ×2 IMPLANT
GLOVE SKINSENSE NS SZ7.0 (GLOVE) ×2
GLOVE SKINSENSE STRL SZ7.0 (GLOVE) ×1 IMPLANT
GLOVE SS BIOGEL STRL SZ 8 (GLOVE) ×1 IMPLANT
GLOVE SUPERSENSE BIOGEL SZ 8 (GLOVE) ×2
GLOVE SURG SS PI 6.5 STRL IVOR (GLOVE) ×6 IMPLANT
GOWN STRL REUS W/ TWL LRG LVL3 (GOWN DISPOSABLE) ×3 IMPLANT
GOWN STRL REUS W/ TWL XL LVL3 (GOWN DISPOSABLE) ×1 IMPLANT
GOWN STRL REUS W/TWL LRG LVL3 (GOWN DISPOSABLE) ×6
GOWN STRL REUS W/TWL XL LVL3 (GOWN DISPOSABLE) ×2
HANDPIECE INTERPULSE COAX TIP (DISPOSABLE)
KIT BASIN OR (CUSTOM PROCEDURE TRAY) ×3 IMPLANT
KIT ROOM TURNOVER OR (KITS) ×3 IMPLANT
MANIFOLD NEPTUNE II (INSTRUMENTS) ×3 IMPLANT
NEEDLE HYPO 25GX1X1/2 BEV (NEEDLE) ×3 IMPLANT
NS IRRIG 1000ML POUR BTL (IV SOLUTION) ×3 IMPLANT
PACK ORTHO EXTREMITY (CUSTOM PROCEDURE TRAY) ×3 IMPLANT
PAD ARMBOARD 7.5X6 YLW CONV (MISCELLANEOUS) ×12 IMPLANT
PAD CAST 4YDX4 CTTN HI CHSV (CAST SUPPLIES) ×2 IMPLANT
PADDING CAST COTTON 4X4 STRL (CAST SUPPLIES) ×4
SET HNDPC FAN SPRY TIP SCT (DISPOSABLE) IMPLANT
SPEAR EYE SURG WECK-CEL (MISCELLANEOUS) ×3 IMPLANT
SPLINT FIBERGLASS 3X12 (CAST SUPPLIES) ×3 IMPLANT
SPONGE LAP 18X18 X RAY DECT (DISPOSABLE) ×3 IMPLANT
SPONGE LAP 4X18 X RAY DECT (DISPOSABLE) IMPLANT
SUT ETHILON 8 0 TG100 8 (SUTURE) ×3 IMPLANT
SUT PROLENE 4 0 P 3 18 (SUTURE) ×6 IMPLANT
SUT VIC AB 3-0 FS2 27 (SUTURE) ×3 IMPLANT
SYR CONTROL 10ML LL (SYRINGE) ×3 IMPLANT
TOWEL OR 17X24 6PK STRL BLUE (TOWEL DISPOSABLE) ×3 IMPLANT
TOWEL OR 17X26 10 PK STRL BLUE (TOWEL DISPOSABLE) ×3 IMPLANT
TUBE ANAEROBIC SPECIMEN COL (MISCELLANEOUS) IMPLANT
TUBE CONNECTING 12'X1/4 (SUCTIONS) ×1
TUBE CONNECTING 12X1/4 (SUCTIONS) ×2 IMPLANT
WATER STERILE IRR 1000ML POUR (IV SOLUTION) IMPLANT
YANKAUER SUCT BULB TIP NO VENT (SUCTIONS) ×3 IMPLANT

## 2015-01-01 NOTE — Discharge Instructions (Signed)
Please elevate and keep your bandage clean and dry  Take an aspirin a day  Keep bandage clean and dry.  Call for any problems.  No smoking.  Criteria for driving a car: you should be off your pain medicine for 7-8 hours, able to drive one handed(confident), thinking clearly and feeling able in your judgement to drive. Continue elevation as it will decrease swelling.  If instructed by MD move your fingers within the confines of the bandage/splint.  Use ice if instructed by your MD. Call immediately for any sudden loss of feeling in your hand/arm or change in functional abilities of the extremity.  We recommend that you to take vitamin C 1000 mg a day to promote healing. We also recommend that if you require  pain medicine that you take a stool softener to prevent constipation as most pain medicines will have constipation side effects. We recommend either Peri-Colace or Senokot and recommend that you also consider adding MiraLAX to prevent the constipation affects from pain medicine if you are required to use them. These medicines are over the counter and maybe purchased at a local pharmacy. A cup of yogurt and a probiotic can also be helpful during the recovery process as the medicines can disrupt your intestinal environment.

## 2015-01-01 NOTE — Consult Note (Signed)
Reason for Consult: Left hand laceration  Referring Physician: Emergency room staff  Luke Francis is an 34 y.o. male.  HPI: The patient is a 34 year old male who presented to the emergency room setting for evaluation of his left hand after sustaining an injury earlier this morning. The patient states that he was looking at an old hunting knife when he accidentally punctured the dorsal radial aspect of his left hand. He states there was a significant amount of bleeding, thus he wrapped the hand and proceeded to the emergency room for further evaluation. Initial evaluation by the emergency room staff showed that he has significant amount of swelling about the thenar eminence and dorsal radial aspect of the hand. Hand surgery consultation was implemented. Upon evaluation the patient was uncomfortable, evaluation of the hand showed that he had swelling of the thenar eminence however his compartments were soft at the thenar eminence mid palmar space and hyperthenar eminence. A significant amount of hematoma formation about the wound present. We discussed with the patient for exploration of the wound at bedside under a local block. After tingling verbal consent local field block about the laceration which measured approximately 2 cm in nature was performed. He tolerated this well. Refill was noted to be intact, FPL and EPL are intact. For this compartment is intact with resisted testing but he is tender. Sensation refill are intact. We verbally consented the patient for exploration at bedside.  Past Medical History  Diagnosis Date  . Torticollis     History reviewed. No pertinent past surgical history.  Family History  Problem Relation Age of Onset  . Hypertension Mother   . Cancer Maternal Grandmother     Breast/ Colon    Social History:  reports that he has quit smoking. His smoking use included Cigarettes. He smoked 1.00 pack per day. He does not have any smokeless tobacco history on file. He  reports that he drinks alcohol. He reports that he does not use illicit drugs.  Allergies: No Known Allergies  Medications: None  No results found for this or any previous visit (from the past 48 hour(s)).  Dg Hand Complete Left  01/01/2015   CLINICAL DATA:  34 year old male with a history of laceration to the risks. Left thumb injury.  EXAM: LEFT HAND - COMPLETE 3+ VIEW  COMPARISON:  07/05/2012  FINDINGS: No acute bony abnormality identified. No radiopaque foreign body. Questionable soft tissue swelling in the region of the left thumb. No subcutaneous gas.  Previous fracture at the second finger distal phalanx has healed.  Surgical dressing projects at the radial aspect of the wrist.  IMPRESSION: No acute bony abnormality.  No radiopaque foreign body with questionable soft tissue swelling at the base of the thumb.  Interval healing of prior fracture distal phalanx second finger.  Signed,  Luke Neu. [address]oreta [address], Luke Francis  [facility]  [facility]   Electronically Signed   By: Luke Mor D.O.   On: 01/01/2015 13:03    Review of Systems  Constitutional: Negative.   HENT: Negative.   Eyes: Negative.   Cardiovascular: Negative.   Genitourinary: Negative.   Musculoskeletal:       Patient has a history of chronic neck and shoulder pain as well as some degree of back pain which is currently under Worker's Compensation been you and has been seen and treated and followed by Luke Francis.  Skin: Negative.   Neurological: Negative.   Endo/Heme/Allergies: Negative.    Blood pressure 154/99,  pulse 87, temperature 97.4 F (36.3 C), temperature source Oral, resp. rate 27, SpO2 99 %. Physical Exam  ..The patient is alert and oriented in no acute distress. The patient complains of pain in the affected upper extremity.  The patient is noted to have a normal HEENT exam. Lung fields show equal chest expansion and no shortness of breath. Abdomen exam is nontender without  distention. Lower extremity examination does not show any fracture dislocation or blood clot symptoms. Pelvis is stable and the neck and back are stable and nontender. Please see history of present illness for examination of the upper extremity Assessment/Plan: Left hand laceration/puncture wound rule out arterial involvement After obtaining verbal consent and sterilely prepping the upper extremity the patient was given a local field block about the laceration consisting of lidocaine 1% without epinephrine proximally 20 mL. He tolerated this well there were no cough occasions. It was then prepped and draped in usual sterile fashion. Formation of the wound showed that there was a significant amount of hematoma formation which was expressed manually, uses EPL tendon is intact however the puncture wound does go to the level of the periosteal tissue. I should note in the course of his exploration the patient became significant and comfortable in regards to his chronic back and neck pain is having difficulties laying still at the time of the exploration thus decision was made to abort the bedside procedure. We discussed with the patient and his mother all issues at length and have elected proceed for formal exploration, irrigation and debridement and repair of associated structures and a formal operative suite. All questions were encouraged and answered. .We are planning surgery for your upper extremity. The risk and benefits of surgery to include risk of bleeding, infection, anesthesia,  damage to normal structures and failure of the surgery to accomplish its intended goals of relieving symptoms and restoring function have been discussed in detail. With this in mind we plan to proceed. I have specifically discussed with the patient the pre-and postoperative regime and the dos and don'ts and risk and benefits in great detail. Risk and benefits of surgery also include risk of dystrophy(CRPS), chronic nerve pain,  failure of the healing process to go onto completion and other inherent risks of surgery The relavent the pathophysiology of the disease/injury process, as well as the alternatives for treatment and postoperative course of action has been discussed in great detail with the patient who desires to proceed.  We will Luke Francis everything in our power to help you (the patient) restore function to the upper extremity. It is a pleasure to see this patient today.    Luke Francis 01/01/2015, 5:04 PM

## 2015-01-01 NOTE — Anesthesia Postprocedure Evaluation (Signed)
  Anesthesia Post-op Note  Patient: Luke Francis  Procedure(s) Performed: Procedure(s): IRRIGATION AND DEBRIDEMENT LEFT EXTREMITY (Left) RADIAL ARTERY AND TENDON REPAIR (Left)  Patient Location: PACU  Anesthesia Type:General  Level of Consciousness: awake and alert   Airway and Oxygen Therapy: Patient Spontanous Breathing  Post-op Pain: mild  Post-op Assessment: Post-op Vital signs reviewed  Post-op Vital Signs: stable  Last Vitals:  Filed Vitals:   01/01/15 2004  BP:   Pulse: 89  Temp:   Resp: 14    Complications: No apparent anesthesia complications

## 2015-01-01 NOTE — ED Notes (Signed)
Pt was working with a Educational psychologisthunting knife, slipped, stabbing himself in left hand. Puncture wound, squirting blood. Pressure dressing applied.

## 2015-01-01 NOTE — Anesthesia Preprocedure Evaluation (Signed)
Anesthesia Evaluation  Patient identified by MRN, date of birth, ID band Patient awake  General Assessment Comment:History noted. CE  Airway Mallampati: II  TM Distance: >3 FB Neck ROM: Full    Dental   Pulmonary former smoker,  breath sounds clear to auscultation        Cardiovascular negative cardio ROS  Rhythm:Regular Rate:Normal     Neuro/Psych    GI/Hepatic negative GI ROS, Neg liver ROS,   Endo/Other  negative endocrine ROS  Renal/GU negative Renal ROS     Musculoskeletal   Abdominal   Peds  Hematology   Anesthesia Other Findings   Reproductive/Obstetrics                             Anesthesia Physical Anesthesia Plan  ASA: II  Anesthesia Plan: General   Post-op Pain Management:    Induction: Intravenous  Airway Management Planned: Oral ETT  Additional Equipment:   Intra-op Plan:   Post-operative Plan: Extubation in OR  Informed Consent: I have reviewed the patients History and Physical, chart, labs and discussed the procedure including the risks, benefits and alternatives for the proposed anesthesia with the patient or authorized representative who has indicated his/her understanding and acceptance.   Dental advisory given  Plan Discussed with: Anesthesiologist and CRNA  Anesthesia Plan Comments:         Anesthesia Quick Evaluation

## 2015-01-01 NOTE — ED Notes (Signed)
Luke Francis Buchanan, Ortho PA, in w/pt and mother.

## 2015-01-01 NOTE — Op Note (Signed)
See dictation #[id number] Amanda PeaGramig MD

## 2015-01-01 NOTE — ED Provider Notes (Signed)
CSN: [id number]     Arrival date & time 01/01/15  1131 History  This chart was scribed for Francee Piccolo, PA-C working with Derwood Kaplan, MD by Evon Slack, ED Scribe. This patient was seen in room TR08C/TR08C and the patient's care was started at 12:00 PM.    Chief Complaint  Patient presents with  . Extremity Laceration   Patient is a 34 y.o. male presenting with skin laceration. The history is provided by the patient. No language interpreter was used.  Laceration Location:  Hand Hand laceration location:  L hand Bleeding: controlled with pressure   Time since incident:  1 hour Laceration mechanism:  Knife Pain details:    Severity:  Moderate   Timing:  Constant Relieved by:  None tried Worsened by:  Movement Tetanus status:  Out of date  HPI Comments: Luke Francis is a 34 y.o. male who presents to the Emergency Department complaining of new left hand laceration onset PTA at 11 AM. Pt states that he slipped while he was working with a rusty hunting knife and accidentally stabbed himself in the hand. Pt states that the pain is worse when moving his thumb. Denies previous injury to hand. Pt states that he his right hand dominant. Patient works in [employer].  Pt states that his tetanus is not UTD.   Past Medical History  Diagnosis Date  . Torticollis    History reviewed. No pertinent past surgical history. Family History  Problem Relation Age of Onset  . Hypertension Mother   . Cancer Maternal Grandmother     Breast/ Colon   History  Substance Use Topics  . Smoking status: Former Smoker -- 1.00 packs/day    Types: Cigarettes  . Smokeless tobacco: Not on file  . Alcohol Use: Yes     Comment: social    Review of Systems  Skin: Positive for wound.  Neurological: Negative for weakness and numbness.  All other systems reviewed and are negative.   Allergies  Review of patient's allergies indicates no known allergies.  Home Medications   Prior to  Admission medications   Not on File   BP 145/93 mmHg  Pulse 96  Temp(Src) 97.9 F (36.6 C) (Oral)  Resp 16  SpO2 98%   Physical Exam  Constitutional: He is oriented to person, place, and time. He appears well-developed and well-nourished. No distress.  HENT:  Head: Normocephalic and atraumatic.  Right Ear: External ear normal.  Left Ear: External ear normal.  Nose: Nose normal.  Mouth/Throat: Oropharynx is clear and moist.  Eyes: Conjunctivae are normal.  Neck: Normal range of motion. Neck supple.  No nuchal rigidity.   Cardiovascular: Normal rate.   Pulmonary/Chest: Effort normal.  Abdominal: Soft.  Musculoskeletal:       Left hand: He exhibits decreased range of motion, tenderness, laceration and swelling. Normal sensation noted. Decreased strength noted. He exhibits finger abduction and thumb/finger opposition.  See picture below  L thumb - decreased opposition, extension, flexion, adduction, abduction.   Neurological: He is alert and oriented to person, place, and time.  Skin: Skin is warm and dry. He is not diaphoretic.  Psychiatric: He has a normal mood and affect.  Nursing note and vitals reviewed.            ED Course  Procedures (including critical care time) DIAGNOSTIC STUDIES: Oxygen Saturation is 98% on RA, normal by my interpretation.    COORDINATION OF CARE: 12:30 PM-Discussed treatment plan with pt at bedside and pt agreed  to plan.     Labs Review Labs Reviewed - No data to display  Imaging Review Dg Hand Complete Left  01/01/2015   CLINICAL DATA:  34 year old male with a history of laceration to the risks. Left thumb injury.  EXAM: LEFT HAND - COMPLETE 3+ VIEW  COMPARISON:  07/05/2012  FINDINGS: No acute bony abnormality identified. No radiopaque foreign body. Questionable soft tissue swelling in the region of the left thumb. No subcutaneous gas.  Previous fracture at the second finger distal phalanx has healed.  Surgical dressing projects at  the radial aspect of the wrist.  IMPRESSION: No acute bony abnormality.  No radiopaque foreign body with questionable soft tissue swelling at the base of the thumb.  Interval healing of prior fracture distal phalanx second finger.  Signed,  Yvone NeuJaime S. Loreta AveWagner, DO  [facility]  [facility]   Electronically Signed   By: Gilmer MorJaime  Wagner D.O.   On: 01/01/2015 13:03     EKG Interpretation None      MDM   Final diagnoses:  Hand laceration, left, initial encounter   Filed Vitals:   01/01/15 1351  BP: 145/93  Pulse: 96  Temp: 97.9 F (36.6 C)  Resp: 16   Afebrile, NAD, non-toxic appearing, AAOx4.  Neurovascularly intact. Normal sensation. Large hematoma noted to left thenar eminence. Decreased range of motion of thumb noted. X-ray reviewed without bony abnormality. Pain managed on emergency department. Dr. Amanda PeaGramig was consulted. [name], PA-C has seen and evaluated the patient and will take to the OR. Patient d/w with Dr. Rhunette CroftNanavati, agrees with plan.       I personally performed the services described in this documentation, which was scribed in my presence. The recorded information has been reviewed and is accurate.       Francee PiccoloJennifer Branon Sabine, PA-C 01/01/15 1558  Derwood KaplanAnkit Nanavati, MD 01/03/15 [phone]

## 2015-01-01 NOTE — ED Notes (Signed)
OR consent signed by pt after procedure explained to pt by Dr. Amanda PeaGramig

## 2015-01-01 NOTE — Anesthesia Procedure Notes (Signed)
Procedure Name: LMA Insertion Date/Time: 01/01/2015 5:25 PM Performed by: Reine JustFLOWERS, Ramin Zoll T Pre-anesthesia Checklist: Patient identified, Emergency Drugs available, Suction available, Patient being monitored and Timeout performed Patient Re-evaluated:Patient Re-evaluated prior to inductionOxygen Delivery Method: Circle system utilized and Simple face mask Preoxygenation: Pre-oxygenation with 100% oxygen Intubation Type: IV induction Ventilation: Mask ventilation without difficulty LMA: LMA inserted LMA Size: 5.0 Number of attempts: 1 Airway Equipment and Method: Patient positioned with wedge pillow Placement Confirmation: positive ETCO2 and breath sounds checked- equal and bilateral Tube secured with: Tape Dental Injury: Teeth and Oropharynx as per pre-operative assessment

## 2015-01-01 NOTE — ED Notes (Signed)
All pt's belongings given to pt's mom and sent home with her.

## 2015-01-01 NOTE — Transfer of Care (Signed)
Immediate Anesthesia Transfer of Care Note  Patient: Luke Francis  Procedure(s) Performed: Procedure(s): IRRIGATION AND DEBRIDEMENT LEFT EXTREMITY (Left) RADIAL ARTERY AND TENDON REPAIR (Left)  Patient Location: PACU  Anesthesia Type:General  Level of Consciousness: awake, alert , oriented and patient cooperative  Airway & Oxygen Therapy: Patient Spontanous Breathing and Patient connected to nasal cannula oxygen  Post-op Assessment: Report given to RN and Post -op Vital signs reviewed and stable  Post vital signs: Reviewed and stable  Last Vitals:  Filed Vitals:   01/01/15 1913  BP:   Pulse:   Temp: 36.4 C  Resp:     Complications: No apparent anesthesia complications

## 2015-01-01 NOTE — ED Notes (Signed)
Suture cart at bedside 

## 2015-01-02 ENCOUNTER — Encounter (HOSPITAL_COMMUNITY): Payer: Self-pay | Admitting: Orthopedic Surgery

## 2015-01-02 NOTE — Op Note (Signed)
[facility]:  [place], [place]                  ACCOUNT NO.:  [id number]  MEDICAL RECORD NO.:  [id number]  LOCATION:  [facility]                         FACILITY:  [facility]  PHYSICIAN:  Dionne AnoWilliam M. Floyd Wade, M.D.DATE OF BIRTH:  [birth year]  DATE OF PROCEDURE: DATE OF DISCHARGE:  01/01/2015                              OPERATIVE REPORT   PREOPERATIVE DIAGNOSIS:  Left first web space laceration with arterial and tendon involvement.  POSTOPERATIVE DIAGNOSIS:  Left first web space laceration with arterial and tendon involvement.  PROCEDURE: 1. Irrigation and debridement of skin, subcutaneous tissue, muscle,     tendon, and periosteal tissue, this was an excisional debridement     with curette, knife, blade, and scissors. 2. Repair of radial artery (princeps pollicis artery left hand). 3. First dorsal interosseous tendon repair, left hand. 4. Superficial radial nerve neurolysis and exploration, left hand.  SURGEON:  Dionne AnoWilliam M. Amanda PeaGramig, MD  ASSISTANT:  [name], PA-C  COMPLICATIONS:  None.  ANESTHESIA:  General.  TOURNIQUET TIME:  Less than an hour.  INDICATIONS FOR THE PROCEDURE:  A 34 year old male who was injured in using a pocket knife that his grandfather gave him, he states.  This is a knife/hunting knife type apparatus, I should note.  The injury occurred and had significant bleeding that was unable to be controlled in the emergency setting.  We reviewed his exam and felt that he likely had an arterial injury and recommended surgical intervention.  The patient consents to the surgery and exploration with repair as necessary.  OPERATION IN DETAIL:  The patient was seen by myself and Anesthesia, taken to operative suite, underwent a smooth induction of general anesthesia, prepped and draped in usual sterile fashion, Betadine scrub and paint about the left upper extremity.  Following this, the 2 cm puncture was enlarged distally.  We dissected down very carefully.  We noted that extensor  pollicis longus tendon was noted to be intact.  The first dorsal interosseous tendon was lacerated and there was large amount of hematoma and muscle damage to the adductor.  The patient had a laceration to the princeps pollicis artery.  There was a slight back wall remaining and there was also some significant venous injury with a sidewall injury to 2 areas of the veins.  At this time, we assessed the situation, inflated the tourniquet and then performed debridement of skin, subcutaneous tissue, muscle, tendon, and periosteal tissue.  This was an excisional debridement with knife, blade, curette and scissor tip.  3 L of saline were placed through and through the wound for lavage purposes.  Following this, the patient underwent identification of the adductor muscle belly which was lacerated; however meaningful repair of the adductor muscle was not necessary if the tendon appeared to be intact more distally.  We very carefully retracted down, identified the princeps pollicis artery and then repaired it with microsurgical technique under 4.0 Loupe magnification.  Following this, the tourniquet was deflated and we demonstrated good stovepipe flow through the repair.  This was a circumferential repair with a slight bit of the posterior wall towards the floor of the palm remaining.  Multiple 8-0 nylon sutures were used to coapt an arterial  regions.  It was a bit of a tricky repair as this was at the bifurcation of the radial digital artery to the index finger and this accounted for the tremendous hematoma and arterial bleed.  We were able to get a nice repair following this and demonstrating this, I irrigated additionally.  We then closed the interval about the first dorsal interosseous tendon. The first dorsal interosseous was repaired without difficulty and had decent stitch competency.  Following this, the EPL was once again checked and looked to be excellent.  We identified the  superficial radial nerve.  The main branches of the superficial radial nerve looked very good most radially. The ulnar branch was somewhat small for an usual ulnar branch.  I dissected in the area at length and did not see any gross laceration, however to the superficial radial nerve.  Thus a neurolysis was accomplished.  We will watch this closely in the postop period. Certainly, I would expect some degree of sensory disturbance from the trauma itself.  Following this, we then once again irrigated copiously, closed the wound with Prolene.  The patient tolerated this well.  There were no complicating features.  He was placed in a thumb spica splint and we made sure that the patient did not have any excessive widening of the thenar region.  The patient tolerated this well.  There were no complicating features.  All sponge, needle and instrument counts were correct.  I will see him back in the office in 10-14 days, we will remove his stitches and place him in a removable splint.  We will protect his first dorsi interosseous for 3-4 weeks and then allow activities as tolerated and progressive interval motion strengthening. These notes have been discussed and all questions have been encouraged and answered.  We are very careful with handling the patient as he has chronic back and neck pain which is well described in his history.  I have reviewed all issues with him.  DISCHARGE MEDICINES:  Keflex, OxyIR p.r.n. pain, Colace, vitamin C.  I should note, he had excellent warm finger tips at the conclusion of the case and soft compartments with the evacuation of the hematoma being completed of course.     Dionne AnoWilliam M. Amanda PeaGramig, M.D.    [facility]  D:  01/01/2015  T:  01/02/2015  Job:  [id number]

## 2015-07-25 ENCOUNTER — Emergency Department (HOSPITAL_COMMUNITY): Payer: BLUE CROSS/BLUE SHIELD

## 2015-07-25 ENCOUNTER — Emergency Department (HOSPITAL_COMMUNITY)
Admission: EM | Admit: 2015-07-25 | Discharge: 2015-07-25 | Disposition: A | Discharge status: Home / Self Care | Payer: BLUE CROSS/BLUE SHIELD | Attending: Emergency Medicine | Admitting: Emergency Medicine

## 2015-07-25 ENCOUNTER — Encounter (HOSPITAL_COMMUNITY): Payer: Self-pay | Admitting: Emergency Medicine

## 2015-07-25 DIAGNOSIS — Z792 Long term (current) use of antibiotics: Secondary | ICD-10-CM | POA: Insufficient documentation

## 2015-07-25 DIAGNOSIS — R52 Pain, unspecified: Secondary | ICD-10-CM

## 2015-07-25 DIAGNOSIS — M62830 Muscle spasm of back: Secondary | ICD-10-CM | POA: Insufficient documentation

## 2015-07-25 DIAGNOSIS — K5900 Constipation, unspecified: Secondary | ICD-10-CM | POA: Insufficient documentation

## 2015-07-25 DIAGNOSIS — Z87891 Personal history of nicotine dependence: Secondary | ICD-10-CM | POA: Insufficient documentation

## 2015-07-25 MED ORDER — METHOCARBAMOL 500 MG PO TABS: 500.0000 mg | ORAL_TABLET | Freq: Two times a day (BID) | ORAL | Status: AC

## 2015-07-25 MED ORDER — LIDOCAINE 5 % EX PTCH: 1.0000 | MEDICATED_PATCH | CUTANEOUS | Status: AC

## 2015-07-25 MED ORDER — DEXAMETHASONE SODIUM PHOSPHATE 10 MG/ML IJ SOLN
10.0000 mg | Freq: Once | INTRAMUSCULAR | Status: AC
  Administered 2015-07-25: 10 mg via INTRAMUSCULAR
  Filled 2015-07-25: qty 1

## 2015-07-25 MED ORDER — DICLOFENAC SODIUM ER 100 MG PO TB24: 100.0000 mg | ORAL_TABLET | Freq: Every day | ORAL | Status: AC

## 2015-07-25 NOTE — Discharge Instructions (Signed)

## 2015-07-25 NOTE — ED Provider Notes (Signed)
CSN: [id number]     Arrival date & time 07/25/15  0002 History  By signing my name below, I, Luke Francis, attest that this documentation has been prepared under the direction and in the presence of Megahn Killings, MD. Electronically Signed: [name], ED Scribe. 07/25/2015. 12:23 AM.   Chief Complaint  Patient presents with  . Back Pain      Patient is a 34 y.o. male presenting with back pain. The history is provided by the patient. No language interpreter was used.  Back Pain Location:  Lumbar spine and sacro-iliac joint Quality:  Unable to specify Radiates to:  Does not radiate Pain severity:  Moderate Pain is:  Unable to specify Onset quality:  Sudden Duration:  1 day Timing:  Constant Progression:  Unchanged Chronicity:  Recurrent Context: physical stress   Context comment:  Pt presents via EMS for his low back pain. Pt was at physical therapy for his shoulder injury prior to his symptoms beginning Relieved by:  Nothing Worsened by:  Bending, standing and sitting Ineffective treatments:  Narcotics (Pt was given toradol while in route to the ED. Pt tried hydrocodone and cyclobenzaprine for relief of his symptoms. Pt was Rx these medications when the pt had a shoulder injury 1 year ago. ) Associated symptoms: no abdominal pain, no bladder incontinence, no bowel incontinence, no chest pain, no dysuria, no fever, no headaches, no leg pain, no numbness, no paresthesias, no pelvic pain, no perianal numbness, no tingling, no weakness and no weight loss   Risk factors: no hx of cancer and no vascular disease     Past Medical History  Diagnosis Date  . Torticollis    Past Surgical History  Procedure Laterality Date  . I&d extremity Left 01/01/2015    Procedure: IRRIGATION AND DEBRIDEMENT LEFT EXTREMITY;  Surgeon: Dominica Severin, MD;  Location: [facility] OR;  Service: Orthopedics;  Laterality: Left;  . Artery and tendon repair Left 01/01/2015    Procedure: RADIAL ARTERY AND TENDON REPAIR;   Surgeon: Dominica Severin, MD;  Location: [facility] OR;  Service: Orthopedics;  Laterality: Left;   Family History  Problem Relation Age of Onset  . Hypertension Mother   . Cancer Maternal Grandmother     Breast/ Colon   Social History  Substance Use Topics  . Smoking status: Former Smoker -- 1.00 packs/day    Types: Cigarettes  . Smokeless tobacco: Not on file  . Alcohol Use: Yes     Comment: social    Review of Systems  Constitutional: Negative for fever and weight loss.  Cardiovascular: Negative for chest pain.  Gastrointestinal: Negative for abdominal pain and bowel incontinence.  Genitourinary: Negative for bladder incontinence, dysuria and pelvic pain.  Musculoskeletal: Positive for back pain.  Neurological: Negative for tingling, weakness, numbness, headaches and paresthesias.  All other systems reviewed and are negative.   A complete 10 system review of systems was obtained and all systems are negative except as noted in the HPI and PMH.    Allergies  Review of patient's allergies indicates no known allergies.  Home Medications   Prior to Admission medications   Medication Sig Start Date End Date Taking? Authorizing Provider  cephALEXin (KEFLEX) 500 MG capsule Take 1 capsule (500 mg total) by mouth 4 (four) times daily. 01/01/15   Dominica Severin, MD  cephALEXin (KEFLEX) 500 MG capsule Take 1 capsule (500 mg total) by mouth 4 (four) times daily. 01/01/15   Dominica Severin, MD  oxyCODONE (OXY IR/ROXICODONE) 5 MG immediate release  tablet Take 2 tablets (10 mg total) by mouth every 4 (four) hours as needed for moderate pain or severe pain. 01/01/15   Dominica SeverinWilliam Gramig, MD  oxyCODONE (OXY IR/ROXICODONE) 5 MG immediate release tablet Take 2 tablets (10 mg total) by mouth every 4 (four) hours as needed for moderate pain or severe pain. 01/01/15   Dominica SeverinWilliam Gramig, MD   BP 129/88 mmHg  Pulse 115  Temp(Src) 98.1 F (36.7 C) (Oral)  Resp 18  SpO2 97% Physical Exam  Constitutional: He is  oriented to person, place, and time. He appears well-developed and well-nourished. No distress.  HENT:  Head: Normocephalic and atraumatic.  Mouth/Throat: Uvula is midline, oropharynx is clear and moist and mucous membranes are normal. No oropharyngeal exudate.  Eyes: EOM are normal. Pupils are equal, round, and reactive to light.  Neck: Trachea normal and normal range of motion. Neck supple. Carotid bruit is not present.  Cardiovascular: Normal rate, regular rhythm and normal heart sounds.  Exam reveals no gallop and no friction rub.   No murmur heard. Pulmonary/Chest: Effort normal and breath sounds normal. No stridor. No respiratory distress. He has no wheezes. He has no rales.  Abdominal: Soft. Bowel sounds are normal. There is no tenderness. There is no rebound and no guarding.  Constipation ascending transverse upper sigmoid colon.   Musculoskeletal: Normal range of motion.       Right knee: Normal.       Left knee: Normal.       Cervical back: Normal.       Thoracic back: Normal.       Lumbar back: Normal.  No step off or crepitus of cervical, thorarcic, or lumbar spine. Mild right paraspinal spasm right S1.  Neurological: He is alert and oriented to person, place, and time. He exhibits normal muscle tone.  Good DTRs  Skin: Skin is warm and dry.  Psychiatric: He has a normal mood and affect. His behavior is normal.  Nursing note and vitals reviewed.   ED Course  Procedures (including critical care time) DIAGNOSTIC STUDIES: Oxygen Saturation is 97% on RA, nl by my interpretation.    COORDINATION OF CARE: 12:23 AM Discussed treatment plan with pt at bedside which includes warm compress, ibuprofen, and flexeril and pt agreed to plan.    Labs Review Labs Reviewed - No data to display  Imaging Review Dg Lumbar Spine 2-3 Views  07/25/2015  CLINICAL DATA:  Patient woke up this morning with severe low back pain. Difficulty lying flat and standing up. No injury. EXAM: LUMBAR  SPINE - 2-3 VIEW COMPARISON:  01/31/2013 FINDINGS: There is no evidence of lumbar spine fracture. Alignment is normal. Intervertebral disc spaces are maintained. IMPRESSION: Negative. Electronically Signed   By: Burman NievesWilliam  Stevens M.D.   On: 07/25/2015 02:01  I have personally reviewed and evaluated these images and lab results as part of my medical decision-making.    EKG Interpretation None      MDM   Final diagnoses:  None   Results for orders placed or performed during the hospital encounter of 08/17/12  Rapid strep screen  Result Value Ref Range   Streptococcus, Group A Screen (Direct) NEGATIVE NEGATIVE   Dg Lumbar Spine 2-3 Views  07/25/2015  CLINICAL DATA:  Patient woke up this morning with severe low back pain. Difficulty lying flat and standing up. No injury. EXAM: LUMBAR SPINE - 2-3 VIEW COMPARISON:  01/31/2013 FINDINGS: There is no evidence of lumbar spine fracture. Alignment is normal. Intervertebral disc  spaces are maintained. IMPRESSION: Negative. Electronically Signed   By: Burman Nieves M.D.   On: 07/25/2015 02:01      Will add NSAID and robaxin to patient's current narcotic regimen.  Will also give RX for lidoderm patches. Follow up with your PMD for ongoing care   I personally performed the services described in this documentation, which was scribed in my presence. The recorded information has been reviewed and is accurate.      Cy Blamer, MD 07/25/15 [phone]

## 2015-07-25 NOTE — ED Notes (Signed)
Pt arrived via EMS, c/o non traumatic back pain onset yesterday. Lower back pain upon exertion. Pt given torodol 60mg  IV in route. Pt self administered Hydrocodone 325 and  Cyclobenaprine prior to arrival of EMS. BP: 138/92   P:110  R:18 reg.

## 2015-07-25 NOTE — ED Notes (Signed)
Bed: WA06 Expected date:  Expected time:  Means of arrival:  Comments: Low back pain

## 2019-03-01 ENCOUNTER — Telehealth: Payer: Self-pay

## 2019-03-01 NOTE — Telephone Encounter (Signed)
Spoke with patient's mother. Scheduled patient.

## 2019-03-15 ENCOUNTER — Encounter: Payer: Self-pay | Admitting: Family Medicine

## 2019-03-15 ENCOUNTER — Ambulatory Visit (INDEPENDENT_AMBULATORY_CARE_PROVIDER_SITE_OTHER): Payer: BLUE CROSS/BLUE SHIELD | Admitting: Family Medicine

## 2019-03-15 ENCOUNTER — Ambulatory Visit: Payer: Self-pay

## 2019-03-15 ENCOUNTER — Other Ambulatory Visit: Payer: Self-pay

## 2019-03-15 VITALS — BP 100/62 | HR 78 | Ht 74.0 in | Wt 244.0 lb

## 2019-03-15 DIAGNOSIS — M79672 Pain in left foot: Secondary | ICD-10-CM

## 2019-03-15 DIAGNOSIS — M722 Plantar fascial fibromatosis: Secondary | ICD-10-CM

## 2019-03-15 NOTE — Assessment & Plan Note (Addendum)
Mild to moderate.  Proper shoes, discussed avoiding certain activities.  Topical anti-inflammatories.  Patient then will follow-up with me again in 4 to 8 weeks.  Worsening symptoms future consideration will be injections.

## 2019-03-15 NOTE — Patient Instructions (Signed)
Good to see you.  Ice 20 minutes 2 times daily. Usually after activity and before bed. Exercises 3 times a week.  pennsaid pinkie amount topically 2 times daily as needed.  [employer] Total Support Rigid soled shoes See me again in 6 weeks

## 2019-03-15 NOTE — Progress Notes (Signed)
Tawana ScaleZach Smith D.O.  Sports Medicine [address] [place], [place] [address] Phone: [phone] Subjective:   Bruce Donath, Valerie Wolf, am serving as a scribe for Dr. Antoine PrimasZachary Smith.   CC: left heel   ION:[id number]:Subjective  Luke Francis is a 38 y.o. male coming in with complaint of left heel pain. Pain for 2 months on medial aspect of heel. Pain is constant. Does not make a difference with different pairs of shoes. Pain is constant and sharp. No history of any foot issues. Tries to stay off of his feet when he is in pain.     Past Medical History:  Diagnosis Date  . Torticollis    Past Surgical History:  Procedure Laterality Date  . ARTERY AND TENDON REPAIR Left 01/01/2015   Procedure: RADIAL ARTERY AND TENDON REPAIR;  Surgeon: Dominica SeverinWilliam Gramig, MD;  Location: [facility];  Service: Orthopedics;  Laterality: Left;  . I&D EXTREMITY Left 01/01/2015   Procedure: IRRIGATION AND DEBRIDEMENT LEFT EXTREMITY;  Surgeon: Dominica SeverinWilliam Gramig, MD;  Location: [facility];  Service: Orthopedics;  Laterality: Left;   Social History   Socioeconomic History  . Marital status: Single    Spouse name: Not on file  . Number of children: Not on file  . Years of education: Not on file  . Highest education level: Not on file  Occupational History  . Not on file  Social Needs  . Financial resource strain: Not on file  . Food insecurity    Worry: Not on file    Inability: Not on file  . Transportation needs    Medical: Not on file    Non-medical: Not on file  Tobacco Use  . Smoking status: Former Smoker    Packs/day: 1.00    Types: Cigarettes  Substance and Sexual Activity  . Alcohol use: Yes    Comment: social  . Drug use: No  . Sexual activity: Not on file  Lifestyle  . Physical activity    Days per week: Not on file    Minutes per session: Not on file  . Stress: Not on file  Relationships  . Social Musicianconnections    Talks on phone: Not on file    Gets together: Not on file    Attends religious service: Not on file     Active member of club or organization: Not on file    Attends meetings of clubs or organizations: Not on file    Relationship status: Not on file  Other Topics Concern  . Not on file  Social History Narrative  . Not on file   No Known Allergies Family History  Problem Relation Age of Onset  . Hypertension Mother   . Cancer Maternal Grandmother        Breast/ Colon       Current Outpatient Medications (Analgesics):  [name].  Diclofenac Sodium CR (VOLTAREN-XR) 100 MG 24 hr tablet, Take 1 tablet (100 mg total) by mouth daily. [name].  HYDROcodone-acetaminophen (NORCO/VICODIN) 5-325 MG tablet, Take 1 tablet by mouth every 6 (six) hours as needed. pain .  oxyCODONE (OXY IR/ROXICODONE) 5 MG immediate release tablet, Take 2 tablets (10 mg total) by mouth every 4 (four) hours as needed for moderate pain or severe pain. [name].  oxyCODONE (OXY IR/ROXICODONE) 5 MG immediate release tablet, Take 2 tablets (10 mg total) by mouth every 4 (four) hours as needed for moderate pain or severe pain.   Current Outpatient Medications (Other):  .  cephALEXin (KEFLEX) 500 MG capsule, Take  1 capsule (500 mg total) by mouth 4 (four) times daily. .  cephALEXin (KEFLEX) 500 MG capsule, Take 1 capsule (500 mg total) by mouth 4 (four) times daily. [name]  lidocaine (LIDODERM) 5 %, Place 1 patch onto the skin daily. Remove & Discard patch within 12 hours or as directed by MD .  methocarbamol (ROBAXIN) 500 MG tablet, Take 1 tablet (500 mg total) by mouth 2 (two) times daily.    Past medical history, social, surgical and family history all reviewed in electronic medical record.  No pertanent information unless stated regarding to the chief complaint.   Review of Systems:  No headache, visual changes, nausea, vomiting, diarrhea, constipation, dizziness, abdominal pain, skin rash, fevers, chills, night sweats, weight loss, swollen lymph nodes, body aches, joint swelling, muscle aches, chest pain, shortness of breath, mood changes.    Objective  Blood pressure 100/62, pulse 78, height 6\' 2"  (1.88 m), weight 244 lb (110.7 kg), SpO2 98 %.    General: No apparent distress alert and oriented x3 mood and affect normal, dressed appropriately.  HEENT: Pupils equal, extraocular movements intact  Respiratory: Patient's speak in full sentences and does not appear short of breath  Cardiovascular: No lower extremity edema, non tender, no erythema  Skin: Warm dry intact with no signs of infection or rash on extremities or on axial skeleton.  Abdomen: Soft nontender  Neuro: Cranial nerves II through XII are intact, neurovascularly intact in all extremities with 2+ DTRs and 2+ pulses.  Lymph: No lymphadenopathy of posterior or anterior cervical chain or axillae bilaterally.  Gait mild antalgic MSK:  tender with limited range of motion and good stability and symmetric strength and tone of shoulders, elbows, wrist, hip, knee and ankles bilaterally.  Beese left heel ulcer the patient is tender to palpation over the medial calcaneal region.  Minimal breakdown the longitudinal arch.  Patient's Achilles is nontender.  Patient has no tenderness of the toes and good capillary refill.  Limited musculoskeletal ultrasound was performed and interpreted Lyndal Pulley  Limited ultrasound shows the patient does have some mild thickness of the plantar fascia 0.97mm with mild hypoechoic changes.  No increasing Doppler flow.  No tear is appreciated.  No avulsion fracture appreciated.  Mild soft tissue swelling noted.   Impression and Recommendations:     This case required medical decision making of moderate complexity. The above documentation has been reviewed and is accurate and complete Lyndal Pulley, DO       Note: This dictation was prepared with Dragon dictation along with smaller phrase technology. Any transcriptional errors that result from this process are unintentional.

## 2019-04-26 ENCOUNTER — Other Ambulatory Visit: Payer: Self-pay

## 2019-04-26 ENCOUNTER — Ambulatory Visit: Payer: Self-pay | Admitting: Family Medicine

## 2019-04-26 ENCOUNTER — Encounter: Payer: Self-pay | Admitting: Family Medicine

## 2019-04-26 DIAGNOSIS — M722 Plantar fascial fibromatosis: Secondary | ICD-10-CM

## 2019-04-26 NOTE — Progress Notes (Signed)
[facility] [address] [place], [place] [address] Phone: [phone] Subjective:   I Luke Francis am serving as a Education administrator for Dr. Hulan Francis.   CC: Left heel pain follow-up  VEL:[id number]   03/15/2019 Mild to moderate.  Proper shoes, discussed avoiding certain activities.  Topical anti-inflammatories.  Patient then will follow-up with me again in 4 to 8 weeks.  Worsening symptoms future consideration will be injections.  04/26/2019 Luke Francis is a 38 y.o. male coming in with complaint of left heel pain. States that his heel is doing pretty good. Still expericing pain due to being on his feet a lot yesterday.   Onset-  Location Duration-  Character- Aggravating factors- Reliving factors-  Therapies tried-  Severity-     Past Medical History:  Diagnosis Date  . Torticollis    Past Surgical History:  Procedure Laterality Date  . ARTERY AND TENDON REPAIR Left 01/01/2015   Procedure: RADIAL ARTERY AND TENDON REPAIR;  Surgeon: Roseanne Kaufman, MD;  Location: [facility];  Service: Orthopedics;  Laterality: Left;  . I&D EXTREMITY Left 01/01/2015   Procedure: IRRIGATION AND DEBRIDEMENT LEFT EXTREMITY;  Surgeon: Roseanne Kaufman, MD;  Location: [facility];  Service: Orthopedics;  Laterality: Left;   Social History   Socioeconomic History  . Marital status: Single    Spouse name: Not on file  . Number of children: Not on file  . Years of education: Not on file  . Highest education level: Not on file  Occupational History  . Not on file  Social Needs  . Financial resource strain: Not on file  . Food insecurity    Worry: Not on file    Inability: Not on file  . Transportation needs    Medical: Not on file    Non-medical: Not on file  Tobacco Use  . Smoking status: Former Smoker    Packs/day: 1.00    Types: Cigarettes  Substance and Sexual Activity  . Alcohol use: Yes    Comment: social  . Drug use: No  . Sexual activity: Not on file  Lifestyle   . Physical activity    Days per week: Not on file    Minutes per session: Not on file  . Stress: Not on file  Relationships  . Social Herbalist on phone: Not on file    Gets together: Not on file    Attends religious service: Not on file    Active member of club or organization: Not on file    Attends meetings of clubs or organizations: Not on file    Relationship status: Not on file  Other Topics Concern  . Not on file  Social History Narrative  . Not on file   No Known Allergies Family History  Problem Relation Age of Onset  . Hypertension Mother   . Cancer Maternal Grandmother        Breast/ Colon       Current Outpatient Medications (Analgesics):  [name]  Diclofenac Sodium CR (VOLTAREN-XR) 100 MG 24 hr tablet, Take 1 tablet (100 mg total) by mouth daily. [name]  HYDROcodone-acetaminophen (NORCO/VICODIN) 5-325 MG tablet, Take 1 tablet by mouth every 6 (six) hours as needed. pain .  oxyCODONE (OXY IR/ROXICODONE) 5 MG immediate release tablet, Take 2 tablets (10 mg total) by mouth every 4 (four) hours as needed for moderate pain or severe pain. [name]  oxyCODONE (OXY IR/ROXICODONE) 5 MG immediate release tablet, Take 2 tablets (10  mg total) by mouth every 4 (four) hours as needed for moderate pain or severe pain.   Current Outpatient Medications (Other):  .  cephALEXin (KEFLEX) 500 MG capsule, Take 1 capsule (500 mg total) by mouth 4 (four) times daily. .  cephALEXin (KEFLEX) 500 MG capsule, Take 1 capsule (500 mg total) by mouth 4 (four) times daily. [name].  lidocaine (LIDODERM) 5 %, Place 1 patch onto the skin daily. Remove & Discard patch within 12 hours or as directed by MD .  methocarbamol (ROBAXIN) 500 MG tablet, Take 1 tablet (500 mg total) by mouth 2 (two) times daily.    Past medical history, social, surgical and family history all reviewed in electronic medical record.  No pertanent information unless stated regarding to the chief complaint.   Review of Systems:  No  headache, visual changes, nausea, vomiting, diarrhea, constipation, dizziness, abdominal pain, skin rash, fevers, chills, night sweats, weight loss, swollen lymph nodes, body aches, joint swelling,  chest pain, shortness of breath, mood changes.  Positive muscle aches  Objective  Blood pressure 110/72, pulse 97, height 6\' 2"  (1.88 m), weight 242 lb (109.8 kg), SpO2 98 %.    General: No apparent distress alert and oriented x3 mood and affect normal, dressed appropriately.  HEENT: Pupils equal, extraocular movements intact  Respiratory: Patient's speak in full sentences and does not appear short of breath  Cardiovascular: No lower extremity edema, non tender, no erythema  Skin: Warm dry intact with no signs of infection or rash on extremities or on axial skeleton.  Abdomen: Soft nontender  Neuro: Cranial nerves II through XII are intact, neurovascularly intact in all extremities with 2+ DTRs and 2+ pulses.  Lymph: No lymphadenopathy of posterior or anterior cervical chain or axillae bilaterally.  Gait normal with good balance and coordination.  MSK:  Non tender with full range of motion and good stability and symmetric strength and tone of shoulders, elbows, wrist, hip, knee and ankles bilaterally.  Foot exam shows the patient does have some mild breakdown of the longitudinal arch as well as the transverse arch.     Impression and Recommendations:     The above documentation has been reviewed and is accurate and complete Judi SaaZachary M Smith, DO       Note: This dictation was prepared with Dragon dictation along with smaller phrase technology. Any transcriptional errors that result from this process are unintentional.

## 2019-04-26 NOTE — Assessment & Plan Note (Signed)
Patient is doing much better at this time.  Nontender on exam.  Patient is to continue the conservative therapy and follow-up as needed

## 2019-04-26 NOTE — Patient Instructions (Signed)
Good to see you Continue exercises 3 times a week Continue orthotics  See me when you need me [phone]

## 2019-11-16 ENCOUNTER — Ambulatory Visit: Payer: Self-pay | Attending: Internal Medicine

## 2019-11-16 DIAGNOSIS — Z23 Encounter for immunization: Secondary | ICD-10-CM

## 2019-11-16 NOTE — Progress Notes (Signed)
   Covid-19 Vaccination Clinic  Name:  Luke Francis    MRN: [id number] DOB: Jul 23, 1981  11/16/2019  [name] was observed post Covid-19 immunization for 15 minutes without incident. He was provided with Vaccine Information Sheet and instruction to access the V-Safe system.   [name] was instructed to call 911 with any severe reactions post vaccine: [name] Difficulty breathing  . Swelling of face and throat  . A fast heartbeat  . A bad rash all over body  . Dizziness and weakness   Immunizations Administered    Name Date Dose VIS Date Route   Pfizer COVID-19 Vaccine 11/16/2019  1:13 PM 0.3 mL 08/11/2019 Intramuscular   Manufacturer: [employer], [employer]   Lot: [id number]   NDC: [address]-9

## 2019-12-11 ENCOUNTER — Ambulatory Visit: Payer: Self-pay | Attending: Internal Medicine

## 2019-12-11 DIAGNOSIS — Z23 Encounter for immunization: Secondary | ICD-10-CM

## 2019-12-11 NOTE — Progress Notes (Signed)
   Covid-19 Vaccination Clinic  Name:  XSAVIER SEELEY    MRN: [id number] DOB: November 04, 1980  12/11/2019  Mr. Friday was observed post Covid-19 immunization for 15 minutes without incident. He was provided with Vaccine Information Sheet and instruction to access the V-Safe system.   Mr. Tirado was instructed to call 911 with any severe reactions post vaccine: [name] Difficulty breathing  . Swelling of face and throat  . A fast heartbeat  . A bad rash all over body  . Dizziness and weakness   Immunizations Administered    Name Date Dose VIS Date Route   Pfizer COVID-19 Vaccine 12/11/2019  1:55 PM 0.3 mL 08/11/2019 Intramuscular   Manufacturer: [employer], [employer]   Lot: [id number]   NDC: [address]-3

## 2020-01-15 ENCOUNTER — Encounter: Payer: Self-pay | Admitting: Family Medicine

## 2020-01-15 ENCOUNTER — Other Ambulatory Visit: Payer: Self-pay

## 2020-01-15 ENCOUNTER — Ambulatory Visit (INDEPENDENT_AMBULATORY_CARE_PROVIDER_SITE_OTHER): Payer: Self-pay | Admitting: Family Medicine

## 2020-01-15 DIAGNOSIS — M545 Low back pain, unspecified: Secondary | ICD-10-CM | POA: Insufficient documentation

## 2020-01-15 DIAGNOSIS — M999 Biomechanical lesion, unspecified: Secondary | ICD-10-CM | POA: Insufficient documentation

## 2020-01-15 MED ORDER — TIZANIDINE HCL 4 MG PO TABS: 4.0000 mg | ORAL_TABLET | Freq: Four times a day (QID) | ORAL | 1 refills | Status: AC | PRN

## 2020-01-15 NOTE — Assessment & Plan Note (Signed)

## 2020-01-15 NOTE — Assessment & Plan Note (Signed)
Low back pain.  Seems to be an acute onset.  After lifting some heavy machinery.  Patient is having increasing discomfort and pain again.  Patient given muscle relaxer at night.  Responded well to manipulation.  Follow-up again in 4 to 8 weeks

## 2020-01-15 NOTE — Progress Notes (Signed)
[name] Phone: [phone] Subjective:   [name], am serving as a scribe for Dr. Hulan Saas. This visit occurred during the SARS-CoV-2 public health emergency.  Safety protocols were in place, including screening questions prior to the visit, additional usage of staff PPE, and extensive cleaning of exam room while observing appropriate contact time as indicated for disinfecting solutions.   I'm seeing this patient by the request  of:  Luke Arabian, MD  CC: Back pain  STM:[id number]  Luke Francis is a 39 y.o. male coming in with complaint of low back pain that radiates into right glute for one week. Patient picked up pressure washer with right hand. Did not feel a pop but pain began next day. Last seen in August 2020 for plantar fasciitis. Uses Tylenol for pain.  Rates the severity initially as 8 out of 10 and now is improving but still 6 out of 10     Past Medical History:  Diagnosis Date  . Torticollis    Past Surgical History:  Procedure Laterality Date  . ARTERY AND TENDON REPAIR Left 01/01/2015   Procedure: RADIAL ARTERY AND TENDON REPAIR;  Surgeon: Roseanne Kaufman, MD;  Location: [facility];  Service: Orthopedics;  Laterality: Left;  . I & D EXTREMITY Left 01/01/2015   Procedure: IRRIGATION AND DEBRIDEMENT LEFT EXTREMITY;  Surgeon: Roseanne Kaufman, MD;  Location: [facility];  Service: Orthopedics;  Laterality: Left;   Social History   Socioeconomic History  . Marital status: Single    Spouse name: Not on file  . Number of children: Not on file  . Years of education: Not on file  . Highest education level: Not on file  Occupational History  . Not on file  Tobacco Use  . Smoking status: Former Smoker    Packs/day: 1.00    Types: Cigarettes  Substance and Sexual Activity  . Alcohol use: Yes    Comment: social  . Drug use: No  . Sexual activity: Not on file  Other Topics Concern  . Not on file    Social History Narrative  . Not on file   Social Determinants of Health   Financial Resource Strain:   . Difficulty of Paying Living Expenses:   Food Insecurity:   . Worried About Charity fundraiser in the Last Year:   . Arboriculturist in the Last Year:   Transportation Needs:   . Film/video editor (Medical):   Luke Francis Lack of Transportation (Non-Medical):   Physical Activity:   . Days of Exercise per Week:   . Minutes of Exercise per Session:   Stress:   . Feeling of Stress :   Social Connections:   . Frequency of Communication with Friends and Family:   . Frequency of Social Gatherings with Friends and Family:   . Attends Religious Services:   . Active Member of Clubs or Organizations:   . Attends Archivist Meetings:   Luke Francis Marital Status:    No Known Allergies Family History  Problem Relation Age of Onset  . Hypertension Mother   . Cancer Maternal Grandmother        Breast/ Colon       Current Outpatient Medications (Analgesics):  Luke Francis  Diclofenac Sodium CR (VOLTAREN-XR) 100 MG 24 hr tablet, Take 1 tablet (100 mg total) by mouth daily. Luke Francis  HYDROcodone-acetaminophen (NORCO/VICODIN) 5-325 MG tablet, Take 1 tablet by mouth every 6 (six)  hours as needed. pain .  oxyCODONE (OXY IR/ROXICODONE) 5 MG immediate release tablet, Take 2 tablets (10 mg total) by mouth every 4 (four) hours as needed for moderate pain or severe pain. Luke Francis  oxyCODONE (OXY IR/ROXICODONE) 5 MG immediate release tablet, Take 2 tablets (10 mg total) by mouth every 4 (four) hours as needed for moderate pain or severe pain.   Current Outpatient Medications (Other):  .  cephALEXin (KEFLEX) 500 MG capsule, Take 1 capsule (500 mg total) by mouth 4 (four) times daily. .  cephALEXin (KEFLEX) 500 MG capsule, Take 1 capsule (500 mg total) by mouth 4 (four) times daily. Luke Francis  lidocaine (LIDODERM) 5 %, Place 1 patch onto the skin daily. Remove & Discard patch within 12 hours or as directed by MD .  methocarbamol  (ROBAXIN) 500 MG tablet, Take 1 tablet (500 mg total) by mouth 2 (two) times daily. Luke Francis  tiZANidine (ZANAFLEX) 4 MG tablet, Take 1 tablet (4 mg total) by mouth every 6 (six) hours as needed for muscle spasms.   Reviewed prior external information including notes and imaging from  primary care provider As well as notes that were available from care everywhere and other healthcare systems.  Past medical history, social, surgical and family history all reviewed in electronic medical record.  No pertanent information unless stated regarding to the chief complaint.   Review of Systems:  No headache, visual changes, nausea, vomiting, diarrhea, constipation, dizziness, abdominal pain, skin rash, fevers, chills, night sweats, weight loss, swollen lymph nodes, body aches, joint swelling, chest pain, shortness of breath, mood changes. POSITIVE muscle aches  Objective  Blood pressure 120/76, pulse (!) 105, resp. rate (!) 97, height 6\' 2"  (1.88 m), weight 239 lb (108.4 kg).   General: No apparent distress alert and oriented x3 mood and affect normal, dressed appropriately.  HEENT: Pupils equal, extraocular movements intact  Respiratory: Patient's speak in full sentences and does not appear short of breath  Cardiovascular: No lower extremity edema, non tender, no erythema  Neuro: Cranial nerves II through XII are intact, neurovascularly intact in all extremities with 2+ DTRs and 2+ pulses.  Gait normal with good balance and coordination.  MSK: Low back exam shows the patient has some mild tightness in the paraspinal musculature on the right side.  Mild positive .  Negative straight leg test.  Near full range of motion of the back noted at the moment.   Osteopathic findings  T7 extended rotated and side bent left L2 flexed rotated and side bent right Sacrum right on right    Impression and Recommendations:     This case required medical decision making of moderate complexity. The above  documentation has been reviewed and is accurate and complete [name], DO       Note: This dictation was prepared with Dragon dictation along with smaller phrase technology. Any transcriptional errors that result from this process are unintentional.

## 2020-01-15 NOTE — Patient Instructions (Signed)
Zanaflex at night when needed  Exercise 3 times a week Hope manipulation helps see me again in 5-6 weeks

## 2020-02-08 ENCOUNTER — Ambulatory Visit: Payer: Self-pay | Admitting: Family Medicine

## 2020-02-20 ENCOUNTER — Ambulatory Visit: Payer: Self-pay | Admitting: Family Medicine

## 2021-10-10 NOTE — Progress Notes (Signed)
[facility] [address] [place] [address] Phone: [phone] Subjective:   INadine Counts, am serving as a scribe for Dr. Antoine Primas. This visit occurred during the SARS-CoV-2 public health emergency.  Safety protocols were in place, including screening questions prior to the visit, additional usage of staff PPE, and extensive cleaning of exam room while observing appropriate contact time as indicated for disinfecting solutions.   I'm seeing this patient by the request  of:  Blair Heys, MD  CC: right leg pain   [id number]  Seen May 2021 for OMT  Luke Francis is a 41 y.o. male coming in with complaint of right leg pain. Leg pain began around the beginning of January. Was on the ladder cleaning gutters and later that day muscle feels tense. Sharp pain radiates down the leg posteriorly. Little bit of popping in right knee with minimal pain. Pain is sporadic. With no intervention pain doesn't go away. Take 2 Extra strength tylenols and I ibuprofen to help take the edge off. Pain keeps him up at night.       Past Medical History:  Diagnosis Date   Torticollis    Past Surgical History:  Procedure Laterality Date   ARTERY AND TENDON REPAIR Left 01/01/2015   Procedure: RADIAL ARTERY AND TENDON REPAIR;  Surgeon: Dominica Severin, MD;  Location: [facility] OR;  Service: Orthopedics;  Laterality: Left;   I & D EXTREMITY Left 01/01/2015   Procedure: IRRIGATION AND DEBRIDEMENT LEFT EXTREMITY;  Surgeon: Dominica Severin, MD;  Location: [facility] OR;  Service: Orthopedics;  Laterality: Left;   Social History   Socioeconomic History   Marital status: Single    Spouse name: Not on file   Number of children: Not on file   Years of education: Not on file   Highest education level: Not on file  Occupational History   Not on file  Tobacco Use   Smoking status: Former    Packs/day: 1.00    Types: Cigarettes   Smokeless tobacco: Not on file  Substance and Sexual  Activity   Alcohol use: Yes    Comment: social   Drug use: No   Sexual activity: Not on file  Other Topics Concern   Not on file  Social History Narrative   Not on file   Social Determinants of Health   Financial Resource Strain: Not on file  Food Insecurity: Not on file  Transportation Needs: Not on file  Physical Activity: Not on file  Stress: Not on file  Social Connections: Not on file   No Known Allergies Family History  Problem Relation Age of Onset   Hypertension Mother    Cancer Maternal Games developer Colon    Current Outpatient Medications (Endocrine & Metabolic):    predniSONE (DELTASONE) 20 MG tablet, Take 1 tablet (20 mg total) by mouth daily with breakfast.    Current Outpatient Medications (Analgesics):    Diclofenac Sodium CR (VOLTAREN-XR) 100 MG 24 hr tablet, Take 1 tablet (100 mg total) by mouth daily.   HYDROcodone-acetaminophen (NORCO/VICODIN) 5-325 MG tablet, Take 1 tablet by mouth every 6 (six) hours as needed. pain   oxyCODONE (OXY IR/ROXICODONE) 5 MG immediate release tablet, Take 2 tablets (10 mg total) by mouth every 4 (four) hours as needed for moderate pain or severe pain.   oxyCODONE (OXY IR/ROXICODONE) 5 MG immediate release tablet, Take 2 tablets (10 mg total) by mouth every 4 (four) hours  as needed for moderate pain or severe pain.   Current Outpatient Medications (Other):    gabapentin (NEURONTIN) 100 MG capsule, Take 2 capsules (200 mg total) by mouth at bedtime.   cephALEXin (KEFLEX) 500 MG capsule, Take 1 capsule (500 mg total) by mouth 4 (four) times daily.   cephALEXin (KEFLEX) 500 MG capsule, Take 1 capsule (500 mg total) by mouth 4 (four) times daily.   lidocaine (LIDODERM) 5 %, Place 1 patch onto the skin daily. Remove & Discard patch within 12 hours or as directed by MD   methocarbamol (ROBAXIN) 500 MG tablet, Take 1 tablet (500 mg total) by mouth 2 (two) times daily.   tiZANidine (ZANAFLEX) 4 MG tablet, Take 1 tablet  (4 mg total) by mouth every 6 (six) hours as needed for muscle spasms.   Reviewed prior external information including notes and imaging from  primary care provider As well as notes that were available from care everywhere and other healthcare systems.  Has not been seen recently in any healthcare system.  Past medical history, social, surgical and family history all reviewed in electronic medical record.  No pertanent information unless stated regarding to the chief complaint.   Review of Systems:  No headache, visual changes, nausea, vomiting, diarrhea, constipation, dizziness, abdominal pain, skin rash, fevers, chills, night sweats, weight loss, swollen lymph nodes,  joint swelling, chest pain, shortness of breath, mood changes. POSITIVE muscle aches, body aches  Objective  Blood pressure 126/90, pulse 96, height 6\' 2"  (1.88 m), weight 232 lb (105.2 kg), SpO2 97 %.   General: No apparent distress alert and oriented x3 mood and affect normal, dressed appropriately.  HEENT: Pupils equal, extraocular movements intact  Respiratory: Patient's speak in full sentences and does not appear short of breath  Cardiovascular: No lower extremity edema, non tender, no erythema  Gait normal with good balance and coordination.  MSK: Low back exam does have loss of lordosis.  Patient does have a positive straight leg test on the right side with even 25 degrees of forward flexion.  Radicular symptoms in the L5 distribution.    Impression and Recommendations:     The above documentation has been reviewed and is accurate and complete , DO

## 2021-10-13 ENCOUNTER — Other Ambulatory Visit: Payer: Self-pay

## 2021-10-13 ENCOUNTER — Ambulatory Visit: Payer: Self-pay | Admitting: Family Medicine

## 2021-10-13 VITALS — BP 126/90 | HR 96 | Ht 74.0 in | Wt 232.0 lb

## 2021-10-13 DIAGNOSIS — M545 Low back pain, unspecified: Secondary | ICD-10-CM

## 2021-10-13 MED ORDER — METHYLPREDNISOLONE ACETATE 40 MG/ML IJ SUSP
40.0000 mg | Freq: Once | INTRAMUSCULAR | Status: AC
  Administered 2021-10-13: 40 mg via INTRAMUSCULAR

## 2021-10-13 MED ORDER — KETOROLAC TROMETHAMINE 30 MG/ML IJ SOLN
30.0000 mg | Freq: Once | INTRAMUSCULAR | Status: AC
  Administered 2021-10-13: 30 mg via INTRAMUSCULAR

## 2021-10-13 MED ORDER — GABAPENTIN 100 MG PO CAPS: 200.0000 mg | ORAL_CAPSULE | Freq: Every day | ORAL | 0 refills | Status: AC

## 2021-10-13 MED ORDER — PREDNISONE 20 MG PO TABS: 20.0000 mg | ORAL_TABLET | Freq: Every day | ORAL | 0 refills | Status: AC

## 2021-10-13 NOTE — Patient Instructions (Addendum)
Xray today Injection today Prescriptions ordered Do prescribed exercises at least 3x a week See you again in 4 weeks

## 2021-10-14 NOTE — Assessment & Plan Note (Signed)
Lumbar radiculopathy.  Worsening pain with radicular symptoms.  Likely no significant weakness and deep tendon reflexes are intact.  Patient does have muscle relaxers, given short course of prednisone.  Toradol and Depo-Medrol given today as well.  X-rays of the back ordered.  Patient knows if any weakness to seek medical attention.  Follow-up again in 3 to 4 weeks hopefully will be able to start manipulation or worsening pain will need advanced imaging

## 2021-10-16 ENCOUNTER — Ambulatory Visit (INDEPENDENT_AMBULATORY_CARE_PROVIDER_SITE_OTHER): Payer: Self-pay

## 2021-10-16 ENCOUNTER — Encounter: Payer: Self-pay | Admitting: Family Medicine

## 2021-10-16 DIAGNOSIS — M545 Low back pain, unspecified: Secondary | ICD-10-CM

## 2021-10-27 ENCOUNTER — Other Ambulatory Visit: Payer: Self-pay

## 2021-10-27 DIAGNOSIS — M5416 Radiculopathy, lumbar region: Secondary | ICD-10-CM

## 2021-11-02 ENCOUNTER — Other Ambulatory Visit: Payer: Self-pay

## 2021-11-02 ENCOUNTER — Ambulatory Visit
Admission: RE | Admit: 2021-11-02 | Discharge: 2021-11-02 | Disposition: A | Discharge status: Home / Self Care | Payer: Self-pay | Source: Ambulatory Visit | Attending: Family Medicine | Admitting: Family Medicine

## 2021-11-02 DIAGNOSIS — M5416 Radiculopathy, lumbar region: Secondary | ICD-10-CM

## 2021-11-04 ENCOUNTER — Other Ambulatory Visit: Payer: Self-pay

## 2021-11-04 ENCOUNTER — Encounter: Payer: Self-pay | Admitting: Family Medicine

## 2021-11-04 DIAGNOSIS — M5416 Radiculopathy, lumbar region: Secondary | ICD-10-CM

## 2021-11-06 ENCOUNTER — Ambulatory Visit
Admission: RE | Admit: 2021-11-06 | Discharge: 2021-11-06 | Disposition: A | Discharge status: Home / Self Care | Payer: No Typology Code available for payment source | Source: Ambulatory Visit | Attending: Family Medicine | Admitting: Family Medicine

## 2021-11-06 ENCOUNTER — Other Ambulatory Visit: Payer: Self-pay

## 2021-11-06 DIAGNOSIS — M5416 Radiculopathy, lumbar region: Secondary | ICD-10-CM

## 2021-11-06 MED ORDER — METHYLPREDNISOLONE ACETATE 40 MG/ML INJ SUSP (RADIOLOG
80.0000 mg | Freq: Once | INTRAMUSCULAR | Status: AC
  Administered 2021-11-06: 80 mg via EPIDURAL

## 2021-11-06 MED ORDER — IOPAMIDOL (ISOVUE-M 200) INJECTION 41%
1.0000 mL | Freq: Once | INTRAMUSCULAR | Status: AC
  Administered 2021-11-06: 1 mL via EPIDURAL

## 2021-11-06 NOTE — Discharge Instructions (Signed)

## 2021-11-07 ENCOUNTER — Encounter: Payer: Self-pay | Admitting: Family Medicine

## 2021-11-07 NOTE — Telephone Encounter (Signed)
Patient's mother called stating that the patient is in a lot of pain and is asking what he can take? He does not have any pain medications at home. ? ?Please advise. ? ?Pharmacy: [facility] ? ? ? ?

## 2021-11-07 NOTE — Progress Notes (Deleted)
?Luke Francis D.O. ?[facility] ?[address] [place] [address] ?Phone: [phone] ?Subjective:   ? ?I'm seeing this patient by the request  of:  Blair Heys, MD ? ?CC: low back pain follow up  ? ?LKG:[id number]  ?10/13/2021 ?Lumbar radiculopathy.  Worsening pain with radicular symptoms.  Likely no significant weakness and deep tendon reflexes are intact.  Patient does have muscle relaxers, given short course of prednisone.  Toradol and Depo-Medrol given today as well.  X-rays of the back ordered.  Patient knows if any weakness to seek medical attention.  Follow-up again in 3 to 4 weeks hopefully will be able to start manipulation or worsening pain will need advanced imaging ? ?Updated 11/10/2021 ?Luke Francis is a 41 y.o. male coming in with complaint of LBP and right leg pain. Had epidural on 11/06/2021. ? ?Xray IMPRESSION: ?Query subtle L4 pars defects. ?  ?No other significant finding by plain radiography. ? ?MRI IMPRESSION: ?1. Right central/subarticular disc extrusion at L5-S1 impinging on ?the traversing right S1 nerve root. ?2. Mild bilateral neural foraminal narrowing at L4-5. ?3. 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 ? ?  ? ?Past Medical History:  ?Diagnosis Date  ? Torticollis   ? ?Past Surgical History:  ?Procedure Laterality Date  ? ARTERY AND TENDON REPAIR Left 01/01/2015  ? Procedure: RADIAL ARTERY AND TENDON REPAIR;  Surgeon: Dominica Severin, MD;  Location: [facility];  Service: Orthopedics;  Laterality: Left;  ? I & D EXTREMITY Left 01/01/2015  ? Procedure: IRRIGATION AND DEBRIDEMENT LEFT EXTREMITY;  Surgeon: Dominica Severin, MD;  Location: [facility] OR;  Service: Orthopedics;  Laterality: Left;  ? ?Social History  ? ?Socioeconomic History  ? Marital status: Single  ?  Spouse name: Not on file  ? Number of children: Not on file  ? Years of education: Not on file  ? Highest education level: Not on file  ?Occupational History  ? Not on file  ?Tobacco Use  ? Smoking status: Former  ?   Packs/day: 1.00  ?  Types: Cigarettes  ? Smokeless tobacco: Not on file  ?Substance and Sexual Activity  ? Alcohol use: Yes  ?  Comment: social  ? Drug use: No  ? Sexual activity: Not on file  ?Other Topics Concern  ? Not on file  ?Social History Narrative  ? Not on file  ? ?Social Determinants of Health  ? ?Financial Resource Strain: Not on file  ?Food Insecurity: Not on file  ?Transportation Needs: Not on file  ?Physical Activity: Not on file  ?Stress: Not on file  ?Social Connections: Not on file  ? ?No Known Allergies ?Family History  ?Problem Relation Age of Onset  ? Hypertension Mother   ? Cancer Maternal Grandmother   ?     Breast/ Colon  ? ? ?Current Outpatient Medications (Endocrine & Metabolic):  ?  predniSONE (DELTASONE) 20 MG tablet, Take 1 tablet (20 mg total) by mouth daily with breakfast. ? ? ? ?Current Outpatient Medications (Analgesics):  ?  Diclofenac Sodium CR (VOLTAREN-XR) 100 MG 24 hr tablet, Take 1 tablet (100 mg total) by mouth daily. ?  HYDROcodone-acetaminophen (NORCO/VICODIN) 5-325 MG tablet, Take 1 tablet by mouth every 6 (six) hours as needed. pain ?  oxyCODONE (OXY IR/ROXICODONE) 5 MG immediate release tablet, Take 2 tablets (10 mg total) by mouth every 4 (four) hours as needed for moderate pain or severe pain. ?  oxyCODONE (OXY IR/ROXICODONE) 5 MG immediate release tablet, Take 2 tablets (10 mg  total) by mouth every 4 (four) hours as needed for moderate pain or severe pain. ? ? ?Current Outpatient Medications (Other):  ?  cephALEXin (KEFLEX) 500 MG capsule, Take 1 capsule (500 mg total) by mouth 4 (four) times daily. ?  cephALEXin (KEFLEX) 500 MG capsule, Take 1 capsule (500 mg total) by mouth 4 (four) times daily. ?  gabapentin (NEURONTIN) 100 MG capsule, Take 2 capsules (200 mg total) by mouth at bedtime. ?  lidocaine (LIDODERM) 5 %, Place 1 patch onto the skin daily. Remove & Discard patch within 12 hours or as directed by MD ?  methocarbamol (ROBAXIN) 500 MG tablet, Take 1 tablet  (500 mg total) by mouth 2 (two) times daily. ?  tiZANidine (ZANAFLEX) 4 MG tablet, Take 1 tablet (4 mg total) by mouth every 6 (six) hours as needed for muscle spasms. ? ? ?Reviewed prior external information including notes and imaging from  ?primary care provider ?As well as notes that were available from care everywhere and other healthcare systems. ? ?Past medical history, social, surgical and family history all reviewed in electronic medical record.  No pertanent information unless stated regarding to the chief complaint.  ? ?Review of Systems: ? No headache, visual changes, nausea, vomiting, diarrhea, constipation, dizziness, abdominal pain, skin rash, fevers, chills, night sweats, weight loss, swollen lymph nodes, body aches, joint swelling, chest pain, shortness of breath, mood changes. POSITIVE muscle aches ? ?Objective  ?There were no vitals taken for this visit. ?  ?General: No apparent distress alert and oriented x3 mood and affect normal, dressed appropriately.  ?HEENT: Pupils equal, extraocular movements intact  ?Respiratory: Patient's speak in full sentences and does not appear short of breath  ?Cardiovascular: No lower extremity edema, non tender, no erythema  ?Gait normal with good balance and coordination.  ?MSK:   ? ?  ?Impression and Recommendations:  ?  ? ?The above documentation has been reviewed and is accurate and complete Judi Saa, DO ? ? ? ?

## 2021-11-09 ENCOUNTER — Encounter: Payer: Self-pay | Admitting: Family Medicine

## 2021-11-10 ENCOUNTER — Ambulatory Visit: Payer: Self-pay | Admitting: Family Medicine

## 2021-12-15 NOTE — Progress Notes (Signed)
? ? Luke Francis D.Judd Gaudier ?[facility] ?[address] [place] [address] ?Phone: [phone] ?  ?Assessment and Plan:   ?  ?1. Lumbar radiculopathy ?2. Chronic right-sided low back pain with right-sided sciatica ?3. Acute bilateral thoracic back pain ?-Chronic with exacerbation, subsequent sports medicine visit ?- Chronic low back pain with acute flare after fall on 12/13/21 ?-I feel that symptoms today are mostly muscular due to fall as patient had significant relief in low back pain with radicular symptoms after epidural on 11/06/2021, and he states that this pain feels different than the pain ?- Start meloxicam 15 mg daily x2 weeks.  If still having pain after 2 weeks, complete 3rd-week of meloxicam. May use remaining meloxicam as needed once daily for pain control.  Do not to use additional NSAIDs while taking meloxicam.  May use Tylenol [phone] mg 2 to 3 times a day for breakthrough pain. ?- Start Flexeril 5 mg nightly as needed for muscle spasms.  If this dose is ineffective, can increase to 10 mg nightly ?- X-ray obtained in clinic.  My interpretation: No acute fracture or vertebral collapse.  Pars fracture seen at L4 as previously dictated by lumbar MRI without listhesis.  Thoracic curvature. ? ?Pertinent previous records reviewed include epidural 11/06/2021 ?  ?Follow Up: As needed if no improving or worsening of symptoms in 2 to 3 weeks.  Would repeat physical exam to isolate most significant symptom ?  ?Subjective:   ?I, Jerene Canny, am serving as a Neurosurgeon for Doctor [employer] ? ?Chief Complaint: right hip and low back pain  ? ?HPI:  ?10/13/2021 ?Luke Francis is a 41 y.o. male coming in with complaint of right leg pain. Leg pain began around the beginning of January. Was on the ladder cleaning gutters and later that day muscle feels tense. Sharp pain radiates down the leg posteriorly. Little bit of popping in right knee with minimal pain. Pain is sporadic. With no  intervention pain doesn't go away. Take 2 Extra strength tylenols and I ibuprofen to help take the edge off. Pain keeps him up at night. ? ?12/16/2021 ?Patient states he fell Saturday night down some stairs, states he has a herniate disc, no numbness or tingling just regular constant pain and sharp pain when he tries to roll over in bed, bad pains when he coughs , has taken a couple xtra strength tylenol hasn't helped pain is thoracic and low back pain  ? ?Relevant Historical Information: History of L4 bilateral pars fracture, history of L5-S1 herniated disc ? ?Additional pertinent review of systems negative. ? ? ?Current Outpatient Medications:  ?  gabapentin (NEURONTIN) 100 MG capsule, Take 2 capsules (200 mg total) by mouth at bedtime., Disp: 180 capsule, Rfl: 0 ?  cephALEXin (KEFLEX) 500 MG capsule, Take 1 capsule (500 mg total) by mouth 4 (four) times daily., Disp: 40 capsule, Rfl: 0 ?  cephALEXin (KEFLEX) 500 MG capsule, Take 1 capsule (500 mg total) by mouth 4 (four) times daily., Disp: 40 capsule, Rfl: 0 ?  Diclofenac Sodium CR (VOLTAREN-XR) 100 MG 24 hr tablet, Take 1 tablet (100 mg total) by mouth daily., Disp: 10 tablet, Rfl: 0 ?  HYDROcodone-acetaminophen (NORCO/VICODIN) 5-325 MG tablet, Take 1 tablet by mouth every 6 (six) hours as needed. pain, Disp: , Rfl: 0 ?  lidocaine (LIDODERM) 5 %, Place 1 patch onto the skin daily. Remove & Discard patch within 12 hours or as directed by MD, Disp: 14 patch, Rfl: 0 ?  methocarbamol (ROBAXIN) 500 MG tablet, Take 1 tablet (500 mg total) by mouth 2 (two) times daily., Disp: 20 tablet, Rfl: 0 ?  oxyCODONE (OXY IR/ROXICODONE) 5 MG immediate release tablet, Take 2 tablets (10 mg total) by mouth every 4 (four) hours as needed for moderate pain or severe pain., Disp: 40 tablet, Rfl: 0 ?  oxyCODONE (OXY IR/ROXICODONE) 5 MG immediate release tablet, Take 2 tablets (10 mg total) by mouth every 4 (four) hours as needed for moderate pain or severe pain., Disp: 40 tablet, Rfl:  0 ?  predniSONE (DELTASONE) 20 MG tablet, Take 1 tablet (20 mg total) by mouth daily with breakfast., Disp: 10 tablet, Rfl: 0 ?  tiZANidine (ZANAFLEX) 4 MG tablet, Take 1 tablet (4 mg total) by mouth every 6 (six) hours as needed for muscle spasms., Disp: 30 tablet, Rfl: 1  ? ?Objective:   ?  ?Vitals:  ? 12/16/21 1121  ?Pulse: 90  ?SpO2: 98%  ?Weight: 231 lb (104.8 kg)  ?Height: 6\' 2"  (1.88 m)  ?  ?  ?Body mass index is 29.66 kg/m?.  ?  ?Physical Exam:   ? ?Gen: Appears well, nad, nontoxic and pleasant ?Psych: Alert and oriented, appropriate mood and affect ?Neuro: sensation intact, strength is 5/5 in upper and lower extremities, muscle tone wnl ?Skin: no susupicious lesions or rashes.  Abrasion to right flank, right thigh, right leg without warmth/erythema/drainage ? ?Back - Normal skin, Spine with normal alignment and no deformity.   ?No tenderness to vertebral process palpation.   ?Paraspinous muscles are mildly tender and without spasm ?Straight leg raise negative, though reproduced low back pain ?Gait normal ? ? ?Electronically signed by:  ? D.Luke Francis ?[facility] ?11:44 AM 12/16/21 ?

## 2021-12-16 ENCOUNTER — Ambulatory Visit (INDEPENDENT_AMBULATORY_CARE_PROVIDER_SITE_OTHER): Payer: Self-pay

## 2021-12-16 ENCOUNTER — Ambulatory Visit (INDEPENDENT_AMBULATORY_CARE_PROVIDER_SITE_OTHER): Payer: Self-pay | Admitting: Sports Medicine

## 2021-12-16 VITALS — HR 90 | Ht 74.0 in | Wt 231.0 lb

## 2021-12-16 DIAGNOSIS — M5416 Radiculopathy, lumbar region: Secondary | ICD-10-CM

## 2021-12-16 DIAGNOSIS — G8929 Other chronic pain: Secondary | ICD-10-CM

## 2021-12-16 DIAGNOSIS — M5441 Lumbago with sciatica, right side: Secondary | ICD-10-CM

## 2021-12-16 DIAGNOSIS — M546 Pain in thoracic spine: Secondary | ICD-10-CM

## 2021-12-16 MED ORDER — CYCLOBENZAPRINE HCL 5 MG PO TABS: 5.0000 mg | ORAL_TABLET | Freq: Three times a day (TID) | ORAL | 0 refills | Status: AC | PRN

## 2021-12-16 MED ORDER — MELOXICAM 15 MG PO TABS: 15.0000 mg | ORAL_TABLET | Freq: Every day | ORAL | 0 refills | Status: AC

## 2021-12-16 NOTE — Patient Instructions (Addendum)
Good to see you  ?Start meloxicam 15 mg daily x2 weeks.  If still having pain after 2 weeks, complete 3rd-week of meloxicam. May use remaining meloxicam as needed once daily for pain control.  Do not to use additional NSAIDs while taking meloxicam.  May use Tylenol [phone] mg 2 to 3 times a day for breakthrough pain. ?Flexeril 5 mg nightly for muscle spasm if ineffective you can increase 10 mg 15  ?As needed follow up  ? ?

## 2021-12-18 ENCOUNTER — Encounter: Payer: Self-pay | Admitting: Sports Medicine

## 2021-12-18 ENCOUNTER — Other Ambulatory Visit: Payer: Self-pay | Admitting: Sports Medicine

## 2021-12-18 NOTE — Telephone Encounter (Signed)
Pt was called and told that flexeril 5 mg nightly should be taken,  and that 3x a day was made in error. He was made an appointment for 12/19/2021 ?

## 2021-12-19 ENCOUNTER — Ambulatory Visit (INDEPENDENT_AMBULATORY_CARE_PROVIDER_SITE_OTHER): Payer: Self-pay | Admitting: Sports Medicine

## 2021-12-19 VITALS — HR 104 | Ht 74.0 in | Wt 231.0 lb

## 2021-12-19 DIAGNOSIS — M5441 Lumbago with sciatica, right side: Secondary | ICD-10-CM

## 2021-12-19 DIAGNOSIS — G8929 Other chronic pain: Secondary | ICD-10-CM

## 2021-12-19 DIAGNOSIS — M5416 Radiculopathy, lumbar region: Secondary | ICD-10-CM

## 2021-12-19 DIAGNOSIS — M5126 Other intervertebral disc displacement, lumbar region: Secondary | ICD-10-CM

## 2021-12-19 NOTE — Patient Instructions (Addendum)
Good to see you  ?Continue meloxicam 15 mg daily x2 weeks.  If still having pain after 2 weeks, complete 3rd-week of meloxicam. May use remaining meloxicam as needed once daily for pain control.  Do not to use additional NSAIDs while taking meloxicam.  May use Tylenol [phone] mg 2 to 3 times a day for breakthrough pain. ?Can take flexeril 5 mg twice a day  ?Epidural referral right L5-S1 ?Follow up with [place] 2 weeks after your epidural to discuss results  ? ?

## 2021-12-19 NOTE — Progress Notes (Signed)
? ? Luke Francis ?[facility] ?[place] ?Phone: [phone] ?  ?Assessment and Plan:   ?  ?1. Lumbar radiculopathy ?2. Chronic right-sided low back pain with right-sided sciatica ?3. Lumbar disc herniation ?-Chronic with exacerbation, subsequent sports medicine visit ?- Chronic low back pain with acute flare after fall on 12/13/2021.  Patient has received moderate improvement in symptoms since office visit on 12/16/2021 in terms of decreased muscular pains, though he is still experiencing a sharp low back pain at a similar area where he was having pain prior to his epidural in 10/2021 ?- We will proceed with repeat epidural right-sided L5-S1 as patient appears to have reflared this injury ?- Continue meloxicam 15 mg daily x2 weeks.  If still having pain after 2 weeks, complete 3rd-week of meloxicam. May use remaining meloxicam as needed once daily for pain control.  Do not to use additional NSAIDs while taking meloxicam.  May use Tylenol [phone] mg 2 to 3 times a day for breakthrough pain. ?- Educated patient that he can take Flexeril 5 mg twice daily.  Patient was concerned that he cannot take this medication with Excedrin, however this is okay to use in the short-term ?- DG INJECT DIAG/THERA/INC NEEDLE/CATH/PLC EPI/LUMB/SAC W/IMG; Future  ?  ?Pertinent previous records reviewed include none ?  ?Follow Up: 2 weeks after epidural to review improvement ?  ?Subjective:   ?I, Pincus Badder, am serving as a Education administrator for Doctor [employer] ?  ?Chief Complaint: right hip and low back pain  ?  ?HPI:  ?10/13/2021 ?Luke Francis is a 41 y.o. male coming in with complaint of right leg pain. Leg pain began around the beginning of January. Was on the ladder cleaning gutters and later that day muscle feels tense. Sharp pain radiates down the leg posteriorly. Little bit of popping in right knee with minimal pain. Pain is sporadic. With no intervention pain doesn't  go away. Take 2 Extra strength tylenols and I ibuprofen to help take the edge off. Pain keeps him up at night. ?  ?12/16/2021 ?Patient states he fell Saturday night down some stairs, states he has a herniate disc, no numbness or tingling just regular constant pain and sharp pain when he tries to roll over in bed, bad pains when he coughs , has taken a couple xtra strength tylenol hasn't helped pain is thoracic and low back pain  ? ?12/19/2021 ?Patient states that he still has a sharp pain that's all over , right down in his lower lumbar area, shooting over into the right hip, needs a different rx than  flexeril because he cant take his Excedrin for migraines ? ?  ?Relevant Historical Information: History of L4 bilateral pars fracture, history of L5-S1 herniated disc ? ?Additional pertinent review of systems negative. ? ? ?Current Outpatient Medications:  ?  cyclobenzaprine (FLEXERIL) 5 MG tablet, Take 1 tablet (5 mg total) by mouth 3 (three) times daily as needed for muscle spasms., Disp: 15 tablet, Rfl: 0 ?  gabapentin (NEURONTIN) 100 MG capsule, Take 2 capsules (200 mg total) by mouth at bedtime., Disp: 180 capsule, Rfl: 0 ?  meloxicam (MOBIC) 15 MG tablet, Take 1 tablet (15 mg total) by mouth daily., Disp: 30 tablet, Rfl: 0 ?  cephALEXin (KEFLEX) 500 MG capsule, Take 1 capsule (500 mg total) by mouth 4 (four) times daily., Disp: 40 capsule, Rfl: 0 ?  cephALEXin (KEFLEX) 500 MG capsule, Take 1 capsule (500 mg total)  by mouth 4 (four) times daily., Disp: 40 capsule, Rfl: 0 ?  Diclofenac Sodium CR (VOLTAREN-XR) 100 MG 24 hr tablet, Take 1 tablet (100 mg total) by mouth daily., Disp: 10 tablet, Rfl: 0 ?  HYDROcodone-acetaminophen (NORCO/VICODIN) 5-325 MG tablet, Take 1 tablet by mouth every 6 (six) hours as needed. pain, Disp: , Rfl: 0 ?  lidocaine (LIDODERM) 5 %, Place 1 patch onto the skin daily. Remove & Discard patch within 12 hours or as directed by MD, Disp: 14 patch, Rfl: 0 ?  methocarbamol (ROBAXIN) 500 MG  tablet, Take 1 tablet (500 mg total) by mouth 2 (two) times daily., Disp: 20 tablet, Rfl: 0 ?  oxyCODONE (OXY IR/ROXICODONE) 5 MG immediate release tablet, Take 2 tablets (10 mg total) by mouth every 4 (four) hours as needed for moderate pain or severe pain., Disp: 40 tablet, Rfl: 0 ?  oxyCODONE (OXY IR/ROXICODONE) 5 MG immediate release tablet, Take 2 tablets (10 mg total) by mouth every 4 (four) hours as needed for moderate pain or severe pain., Disp: 40 tablet, Rfl: 0 ?  predniSONE (DELTASONE) 20 MG tablet, Take 1 tablet (20 mg total) by mouth daily with breakfast., Disp: 10 tablet, Rfl: 0 ?  tiZANidine (ZANAFLEX) 4 MG tablet, Take 1 tablet (4 mg total) by mouth every 6 (six) hours as needed for muscle spasms., Disp: 30 tablet, Rfl: 1  ? ?Objective:   ?  ?Vitals:  ? 12/19/21 0959  ?Pulse: (!) 104  ?SpO2: 99%  ?Weight: 231 lb (104.8 kg)  ?Height: 6\' 2"  (1.88 m)  ?  ?  ?Body mass index is 29.66 kg/m?.  ?  ?Physical Exam:   ? ?Gen: Appears well, nad, nontoxic and pleasant ?Psych: Alert and oriented, appropriate mood and affect ?Neuro: sensation intact, strength is 5/5 in upper and lower extremities, muscle tone wnl ?Skin: no susupicious lesions or rashes.  Abrasion to right flank, right thigh, right leg without warmth/erythema/drainage ?  ?Back - Normal skin, Spine with normal alignment and no deformity.   ?No tenderness to vertebral process palpation.   ?Paraspinous muscles are mildly tender and without spasm ?Straight leg raise negative, though reproduced low back pain ?Gait normal ?Patient points to right lower lumbar and right upper SI as area of most intense pain, though this is not reproduced on physical exam with palpation ? ? ?Electronically signed by:  ?Luke Francis ?[facility] ?10:21 AM 12/19/21 ?

## 2021-12-24 NOTE — Discharge Instructions (Signed)

## 2021-12-25 ENCOUNTER — Ambulatory Visit
Admission: RE | Admit: 2021-12-25 | Discharge: 2021-12-25 | Disposition: A | Discharge status: Home / Self Care | Payer: Self-pay | Source: Ambulatory Visit | Attending: Sports Medicine | Admitting: Sports Medicine

## 2021-12-25 DIAGNOSIS — G8929 Other chronic pain: Secondary | ICD-10-CM

## 2021-12-25 DIAGNOSIS — M5416 Radiculopathy, lumbar region: Secondary | ICD-10-CM

## 2021-12-25 DIAGNOSIS — M5126 Other intervertebral disc displacement, lumbar region: Secondary | ICD-10-CM

## 2021-12-25 MED ORDER — IOPAMIDOL (ISOVUE-M 200) INJECTION 41%
1.0000 mL | Freq: Once | INTRAMUSCULAR | Status: AC
  Administered 2021-12-25: 1 mL via EPIDURAL

## 2021-12-25 MED ORDER — METHYLPREDNISOLONE ACETATE 40 MG/ML INJ SUSP (RADIOLOG
80.0000 mg | Freq: Once | INTRAMUSCULAR | Status: AC
  Administered 2021-12-25: 80 mg via EPIDURAL

## 2022-11-19 IMAGING — XA Imaging study
2 series · 2 of 2 positions shown · non-contrast
Comparison: none

CLINICAL DATA: Lumbosacral spondylosis without myelopathy. Right
L5-S1 disc herniation with right S1 radicular symptoms. Patient
previously experience 100% relief following a right L5-S1 epidural
steroid injection. He presents with recurrent symptoms after falling
down the stairs.

[Series 1: ortho standard · 1 of 1 slices shown (1 of 2)]
[im 1/1]
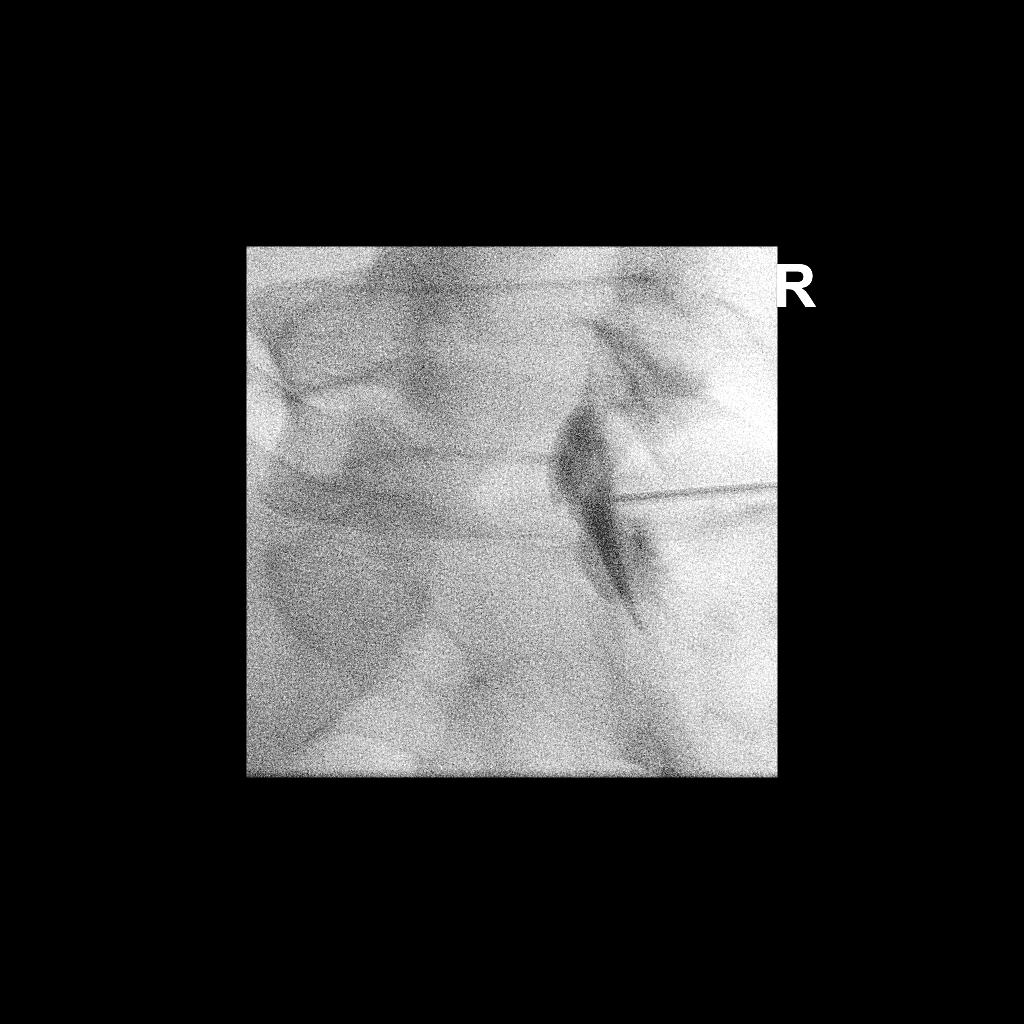

[Series 2: ortho standard · 1 of 1 slices shown (2 of 2)]
[im 1/1]
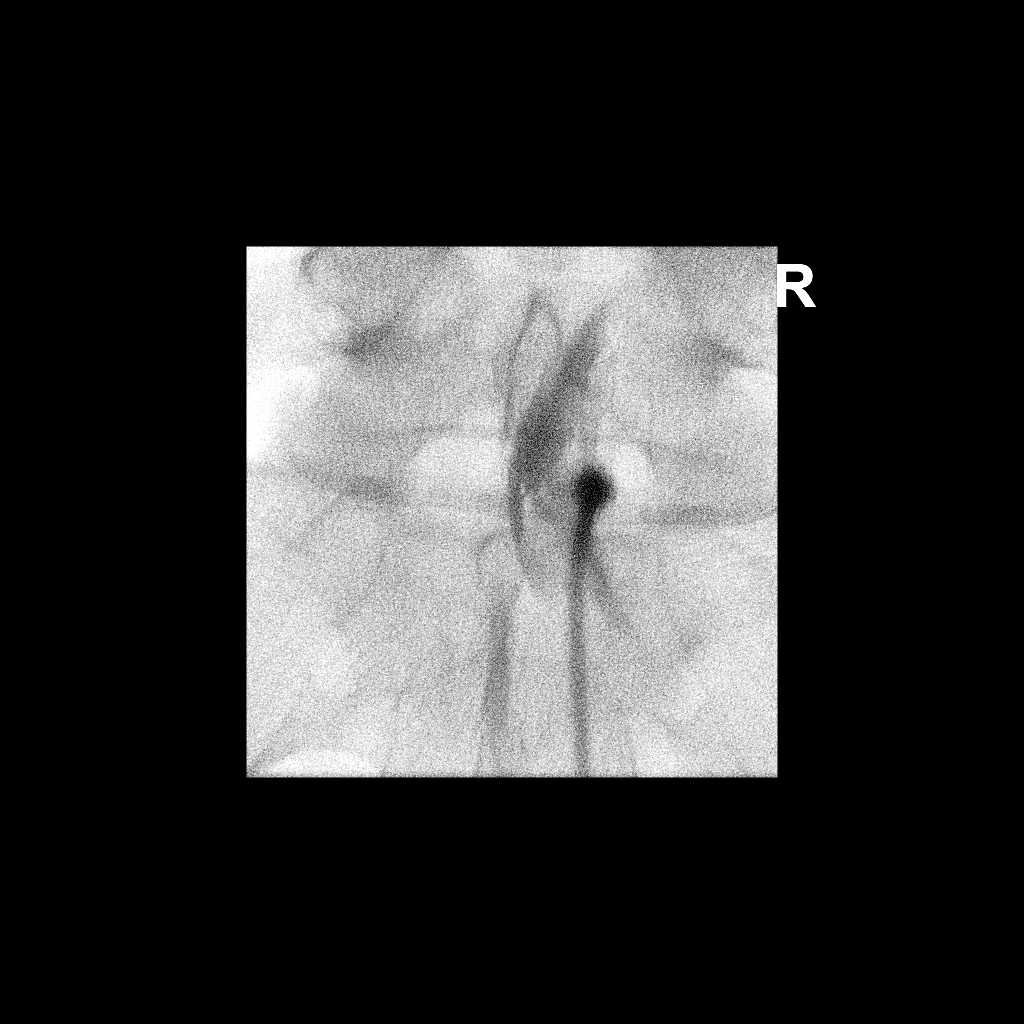

[2 of 2 positions shown; findings below may reference images not displayed]

FLUOROSCOPY:
Radiation Exposure Index (as provided by the fluoroscopic device):
2.0 mGy Kerma

PROCEDURE:
The procedure, risks, benefits, and alternatives were explained to
the patient. Questions regarding the procedure were encouraged and
answered. The patient understands and consents to the procedure.

LUMBAR EPIDURAL INJECTION:

An interlaminar approach was performed on right at L5-S1. The
overlying skin was cleansed and anesthetized. A 20 gauge epidural
needle was advanced using loss-of-resistance technique.

DIAGNOSTIC EPIDURAL INJECTION:

Injection of Isovue-M 200 shows a good epidural pattern with spread
above and below the level of needle placement, primarily on the
right no vascular opacification is seen.

THERAPEUTIC EPIDURAL INJECTION:

80 mg of Depo-Medrol mixed with 2 mL 1% lidocaine were instilled.
The procedure was well-tolerated, and the patient was discharged
thirty minutes following the injection in good condition.

COMPLICATIONS:
None.
IMPRESSION: Technically successful epidural injection on the right L5-S1.
